# Patient Record
Sex: Female | Born: 1960 | Race: Black or African American | Hispanic: Yes | Marital: Married
Health system: Northeastern US, Academic
[De-identification: ages and names within clinical notes are randomized; demographics above are authoritative.]

---

## 2019-09-09 ENCOUNTER — Inpatient Hospital Stay: Admit: 2019-09-09 | Discharge: 2019-09-09 | Payer: MEDICAID | Primary: Internal Medicine

## 2019-09-09 DIAGNOSIS — Z20822 Contact with and (suspected) exposure to covid-19: Secondary | ICD-10-CM

## 2019-09-09 DIAGNOSIS — Z20828 Contact with and (suspected) exposure to other viral communicable diseases: Secondary | ICD-10-CM

## 2019-09-10 LAB — COVID-19 CLEARANCE OR FOR PLACEMENT ONLY: BKR SARS-COV-2 RNA (COVID-19) (YH): NOT DETECTED

## 2019-10-14 ENCOUNTER — Inpatient Hospital Stay: Admit: 2019-10-14 | Discharge: 2019-10-14 | Payer: MEDICAID | Primary: Internal Medicine

## 2019-10-14 DIAGNOSIS — Z20828 Contact with and (suspected) exposure to other viral communicable diseases: Secondary | ICD-10-CM

## 2019-10-15 DIAGNOSIS — Z20822 Contact with and (suspected) exposure to covid-19: Secondary | ICD-10-CM

## 2019-10-15 LAB — SARS COV-2 (COVID-19) RNA: BKR SARS-COV-2 RNA (COVID-19) (YH): NOT DETECTED

## 2019-10-28 ENCOUNTER — Inpatient Hospital Stay: Admit: 2019-10-28 | Discharge: 2019-10-28 | Payer: MEDICAID | Primary: Internal Medicine

## 2019-10-28 DIAGNOSIS — Z20822 Contact with and (suspected) exposure to covid-19: Secondary | ICD-10-CM

## 2019-10-28 DIAGNOSIS — Z20828 Contact with and (suspected) exposure to other viral communicable diseases: Secondary | ICD-10-CM

## 2019-10-29 LAB — COVID-19 CLEARANCE OR FOR PLACEMENT ONLY: BKR SARS-COV-2 RNA (COVID-19) (YH): NOT DETECTED

## 2019-11-06 ENCOUNTER — Encounter: Admit: 2019-11-06 | Payer: PRIVATE HEALTH INSURANCE | Attending: Neuromuscular Medicine | Primary: Internal Medicine

## 2019-11-06 ENCOUNTER — Inpatient Hospital Stay: Admit: 2019-11-06 | Discharge: 2019-11-06 | Payer: MEDICAID | Primary: Internal Medicine

## 2019-11-06 DIAGNOSIS — IMO0002 Cystocele: Secondary | ICD-10-CM

## 2019-11-06 DIAGNOSIS — K219 Gastro-esophageal reflux disease without esophagitis: Secondary | ICD-10-CM

## 2019-11-06 DIAGNOSIS — M1990 Unspecified osteoarthritis, unspecified site: Secondary | ICD-10-CM

## 2019-11-06 DIAGNOSIS — E119 Type 2 diabetes mellitus without complications: Secondary | ICD-10-CM

## 2019-11-06 DIAGNOSIS — G5603 Carpal tunnel syndrome, bilateral upper limbs: Secondary | ICD-10-CM

## 2019-11-06 DIAGNOSIS — J309 Allergic rhinitis, unspecified: Secondary | ICD-10-CM

## 2019-11-06 DIAGNOSIS — X008XXA Other exposure to uncontrolled fire in building or structure, initial encounter: Secondary | ICD-10-CM

## 2019-11-06 DIAGNOSIS — K635 Polyp of colon: Secondary | ICD-10-CM

## 2019-11-06 DIAGNOSIS — F4310 Post-traumatic stress disorder, unspecified: Secondary | ICD-10-CM

## 2019-11-06 DIAGNOSIS — G5600 Carpal tunnel syndrome, unspecified upper limb: Secondary | ICD-10-CM

## 2019-11-06 DIAGNOSIS — F419 Anxiety disorder, unspecified: Secondary | ICD-10-CM

## 2019-11-06 DIAGNOSIS — J45909 Unspecified asthma, uncomplicated: Secondary | ICD-10-CM

## 2019-11-06 DIAGNOSIS — D219 Benign neoplasm of connective and other soft tissue, unspecified: Secondary | ICD-10-CM

## 2019-11-06 DIAGNOSIS — M4180 Other forms of scoliosis, site unspecified: Secondary | ICD-10-CM

## 2019-11-06 NOTE — Other
Results suggestive of mild bilateral CTS.Consider tx options at f/u later this month.

## 2019-11-11 ENCOUNTER — Inpatient Hospital Stay: Admit: 2019-11-11 | Discharge: 2019-11-11 | Payer: MEDICAID | Primary: Internal Medicine

## 2019-11-11 DIAGNOSIS — Z20828 Contact with and (suspected) exposure to other viral communicable diseases: Secondary | ICD-10-CM

## 2019-11-11 DIAGNOSIS — Z20822 Contact with and (suspected) exposure to covid-19: Secondary | ICD-10-CM

## 2019-11-12 LAB — COVID-19 CLEARANCE OR FOR PLACEMENT ONLY: BKR SARS-COV-2 RNA (COVID-19) (YH): NOT DETECTED

## 2019-12-02 ENCOUNTER — Inpatient Hospital Stay: Admit: 2019-12-02 | Discharge: 2019-12-02 | Payer: MEDICAID | Primary: Internal Medicine

## 2019-12-02 DIAGNOSIS — Z20828 Contact with and (suspected) exposure to other viral communicable diseases: Secondary | ICD-10-CM

## 2019-12-03 DIAGNOSIS — Z20822 Contact with and (suspected) exposure to covid-19: Secondary | ICD-10-CM

## 2019-12-03 LAB — COVID-19 CLEARANCE OR FOR PLACEMENT ONLY: BKR SARS-COV-2 RNA (COVID-19) (YH): NOT DETECTED

## 2019-12-17 ENCOUNTER — Inpatient Hospital Stay: Admit: 2019-12-17 | Discharge: 2019-12-17 | Payer: MEDICAID | Primary: Internal Medicine

## 2019-12-17 DIAGNOSIS — Z20828 Contact with and (suspected) exposure to other viral communicable diseases: Secondary | ICD-10-CM

## 2019-12-18 DIAGNOSIS — Z20822 Contact with and (suspected) exposure to covid-19: Secondary | ICD-10-CM

## 2019-12-18 LAB — COVID-19 CLEARANCE OR FOR PLACEMENT ONLY: BKR SARS-COV-2 RNA (COVID-19) (YH): NOT DETECTED

## 2019-12-30 ENCOUNTER — Inpatient Hospital Stay: Admit: 2019-12-30 | Discharge: 2019-12-30 | Payer: MEDICAID | Primary: Internal Medicine

## 2019-12-30 DIAGNOSIS — Z20822 Contact with and (suspected) exposure to covid-19: Secondary | ICD-10-CM

## 2019-12-30 DIAGNOSIS — Z20828 Contact with and (suspected) exposure to other viral communicable diseases: Secondary | ICD-10-CM

## 2019-12-31 LAB — COVID-19 CLEARANCE OR FOR PLACEMENT ONLY: BKR SARS-COV-2 RNA (COVID-19) (YH): NOT DETECTED

## 2020-01-03 ENCOUNTER — Ambulatory Visit: Admit: 2020-01-03 | Payer: MEDICAID | Attending: Plastic Surgery | Primary: Internal Medicine

## 2020-01-03 ENCOUNTER — Encounter: Admit: 2020-01-03 | Payer: PRIVATE HEALTH INSURANCE | Attending: Plastic Surgery | Primary: Internal Medicine

## 2020-01-03 DIAGNOSIS — M4180 Other forms of scoliosis, site unspecified: Secondary | ICD-10-CM

## 2020-01-03 DIAGNOSIS — F419 Anxiety disorder, unspecified: Secondary | ICD-10-CM

## 2020-01-03 DIAGNOSIS — E119 Type 2 diabetes mellitus without complications: Secondary | ICD-10-CM

## 2020-01-03 DIAGNOSIS — IMO0002 Cystocele: Secondary | ICD-10-CM

## 2020-01-03 DIAGNOSIS — J45909 Unspecified asthma, uncomplicated: Secondary | ICD-10-CM

## 2020-01-03 DIAGNOSIS — K219 Gastro-esophageal reflux disease without esophagitis: Secondary | ICD-10-CM

## 2020-01-03 DIAGNOSIS — K635 Polyp of colon: Secondary | ICD-10-CM

## 2020-01-03 DIAGNOSIS — F4310 Post-traumatic stress disorder, unspecified: Secondary | ICD-10-CM

## 2020-01-03 DIAGNOSIS — G5603 Carpal tunnel syndrome, bilateral upper limbs: Secondary | ICD-10-CM

## 2020-01-03 DIAGNOSIS — M1990 Unspecified osteoarthritis, unspecified site: Secondary | ICD-10-CM

## 2020-01-03 DIAGNOSIS — M65312 Trigger thumb, left thumb: Secondary | ICD-10-CM

## 2020-01-03 DIAGNOSIS — X008XXA Other exposure to uncontrolled fire in building or structure, initial encounter: Secondary | ICD-10-CM

## 2020-01-03 DIAGNOSIS — J309 Allergic rhinitis, unspecified: Secondary | ICD-10-CM

## 2020-01-03 DIAGNOSIS — D219 Benign neoplasm of connective and other soft tissue, unspecified: Secondary | ICD-10-CM
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
## 2020-01-13 ENCOUNTER — Inpatient Hospital Stay: Admit: 2020-01-13 | Discharge: 2020-01-13 | Payer: MEDICAID | Primary: Internal Medicine

## 2020-01-13 DIAGNOSIS — Z20822 Contact with and (suspected) exposure to covid-19: Secondary | ICD-10-CM

## 2020-01-13 DIAGNOSIS — Z20828 Contact with and (suspected) exposure to other viral communicable diseases: Secondary | ICD-10-CM

## 2020-01-14 LAB — COVID-19 CLEARANCE OR FOR PLACEMENT ONLY: BKR SARS-COV-2 RNA (COVID-19) (YH): NOT DETECTED

## 2020-01-27 ENCOUNTER — Inpatient Hospital Stay: Admit: 2020-01-27 | Discharge: 2020-01-27 | Payer: MEDICAID | Primary: Internal Medicine

## 2020-01-27 DIAGNOSIS — Z20822 Contact with and (suspected) exposure to covid-19: Secondary | ICD-10-CM

## 2020-01-27 DIAGNOSIS — Z20828 Contact with and (suspected) exposure to other viral communicable diseases: Secondary | ICD-10-CM

## 2020-01-28 LAB — COVID-19 CLEARANCE OR FOR PLACEMENT ONLY: BKR SARS-COV-2 RNA (COVID-19) (YH): NOT DETECTED

## 2020-02-12 ENCOUNTER — Inpatient Hospital Stay: Admit: 2020-02-12 | Discharge: 2020-02-12 | Payer: MEDICAID | Primary: Internal Medicine

## 2020-02-12 DIAGNOSIS — Z20822 Contact with and (suspected) exposure to covid-19: Secondary | ICD-10-CM

## 2020-02-12 DIAGNOSIS — Z20828 Contact with and (suspected) exposure to other viral communicable diseases: Secondary | ICD-10-CM

## 2020-02-13 LAB — COVID-19 CLEARANCE OR FOR PLACEMENT ONLY: BKR SARS-COV-2 RNA (COVID-19) (YH): NOT DETECTED

## 2020-02-20 ENCOUNTER — Encounter: Admit: 2020-02-20 | Payer: PRIVATE HEALTH INSURANCE | Attending: Adult Health | Primary: Internal Medicine

## 2020-02-20 DIAGNOSIS — Z01818 Encounter for other preprocedural examination: Secondary | ICD-10-CM

## 2020-03-01 ENCOUNTER — Ambulatory Visit: Admit: 2020-03-01 | Payer: PRIVATE HEALTH INSURANCE | Primary: Internal Medicine

## 2020-03-04 ENCOUNTER — Encounter: Admit: 2020-03-04 | Payer: PRIVATE HEALTH INSURANCE | Attending: Adult Health | Primary: Internal Medicine

## 2020-03-04 DIAGNOSIS — Z01818 Encounter for other preprocedural examination: Secondary | ICD-10-CM

## 2020-03-08 ENCOUNTER — Telehealth: Admit: 2020-03-08 | Payer: PRIVATE HEALTH INSURANCE | Attending: Physician Assistant | Primary: Internal Medicine

## 2020-03-12 ENCOUNTER — Encounter: Admit: 2020-03-12 | Payer: PRIVATE HEALTH INSURANCE | Attending: Plastic Surgery | Primary: Internal Medicine

## 2020-03-12 ENCOUNTER — Ambulatory Visit: Admit: 2020-03-12 | Payer: PRIVATE HEALTH INSURANCE | Attending: Anesthesiology | Primary: Internal Medicine

## 2020-03-12 DIAGNOSIS — K219 Gastro-esophageal reflux disease without esophagitis: Secondary | ICD-10-CM

## 2020-03-12 DIAGNOSIS — R112 Nausea with vomiting, unspecified: Secondary | ICD-10-CM

## 2020-03-12 DIAGNOSIS — M1990 Unspecified osteoarthritis, unspecified site: Secondary | ICD-10-CM

## 2020-03-12 DIAGNOSIS — K635 Polyp of colon: Secondary | ICD-10-CM

## 2020-03-12 DIAGNOSIS — U071 COVID-19 virus infection: Secondary | ICD-10-CM

## 2020-03-12 DIAGNOSIS — F4310 Post-traumatic stress disorder, unspecified: Secondary | ICD-10-CM

## 2020-03-12 DIAGNOSIS — M65312 Trigger thumb, left thumb: Secondary | ICD-10-CM

## 2020-03-12 DIAGNOSIS — J45909 Unspecified asthma, uncomplicated: Secondary | ICD-10-CM

## 2020-03-12 DIAGNOSIS — D219 Benign neoplasm of connective and other soft tissue, unspecified: Secondary | ICD-10-CM

## 2020-03-12 DIAGNOSIS — X008XXA Other exposure to uncontrolled fire in building or structure, initial encounter: Secondary | ICD-10-CM

## 2020-03-12 DIAGNOSIS — F419 Anxiety disorder, unspecified: Secondary | ICD-10-CM

## 2020-03-12 DIAGNOSIS — IMO0002 Cystocele: Secondary | ICD-10-CM

## 2020-03-12 DIAGNOSIS — M4180 Other forms of scoliosis, site unspecified: Secondary | ICD-10-CM

## 2020-03-12 DIAGNOSIS — J309 Allergic rhinitis, unspecified: Secondary | ICD-10-CM

## 2020-03-12 DIAGNOSIS — E119 Type 2 diabetes mellitus without complications: Secondary | ICD-10-CM

## 2020-03-15 ENCOUNTER — Inpatient Hospital Stay: Admit: 2020-03-15 | Discharge: 2020-03-15 | Payer: MEDICAID | Primary: Internal Medicine

## 2020-03-15 DIAGNOSIS — Z01812 Encounter for preprocedural laboratory examination: Secondary | ICD-10-CM

## 2020-03-15 DIAGNOSIS — Z20822 Contact with and (suspected) exposure to covid-19: Secondary | ICD-10-CM

## 2020-03-15 DIAGNOSIS — Z01818 Encounter for other preprocedural examination: Secondary | ICD-10-CM

## 2020-03-16 LAB — COVID-19 CLEARANCE OR FOR PLACEMENT ONLY: BKR SARS-COV-2 RNA (COVID-19) (YH): NEGATIVE

## 2020-03-18 ENCOUNTER — Encounter: Admit: 2020-03-18 | Payer: PRIVATE HEALTH INSURANCE | Attending: Plastic Surgery | Primary: Internal Medicine

## 2020-03-18 ENCOUNTER — Inpatient Hospital Stay: Admit: 2020-03-18 | Discharge: 2020-03-18 | Payer: MEDICAID | Primary: Internal Medicine

## 2020-03-18 ENCOUNTER — Ambulatory Visit: Admit: 2020-03-18 | Payer: PRIVATE HEALTH INSURANCE | Attending: Anesthesiology | Primary: Internal Medicine

## 2020-03-18 DIAGNOSIS — Z9109 Other allergy status, other than to drugs and biological substances: Secondary | ICD-10-CM

## 2020-03-18 DIAGNOSIS — G629 Polyneuropathy, unspecified: Secondary | ICD-10-CM

## 2020-03-18 DIAGNOSIS — IMO0002 Cystocele: Secondary | ICD-10-CM

## 2020-03-18 DIAGNOSIS — F419 Anxiety disorder, unspecified: Secondary | ICD-10-CM

## 2020-03-18 DIAGNOSIS — K219 Gastro-esophageal reflux disease without esophagitis: Secondary | ICD-10-CM

## 2020-03-18 DIAGNOSIS — Z86010 Personal history of colonic polyps: Secondary | ICD-10-CM

## 2020-03-18 DIAGNOSIS — R112 Nausea with vomiting, unspecified: Secondary | ICD-10-CM

## 2020-03-18 DIAGNOSIS — Z91048 Other nonmedicinal substance allergy status: Secondary | ICD-10-CM

## 2020-03-18 DIAGNOSIS — J45909 Unspecified asthma, uncomplicated: Secondary | ICD-10-CM

## 2020-03-18 DIAGNOSIS — E119 Type 2 diabetes mellitus without complications: Secondary | ICD-10-CM

## 2020-03-18 DIAGNOSIS — U071 COVID-19 virus infection: Secondary | ICD-10-CM

## 2020-03-18 DIAGNOSIS — M419 Scoliosis, unspecified: Secondary | ICD-10-CM

## 2020-03-18 DIAGNOSIS — G709 Myoneural disorder, unspecified: Secondary | ICD-10-CM

## 2020-03-18 DIAGNOSIS — F329 Major depressive disorder, single episode, unspecified: Secondary | ICD-10-CM

## 2020-03-18 DIAGNOSIS — M4180 Other forms of scoliosis, site unspecified: Secondary | ICD-10-CM

## 2020-03-18 DIAGNOSIS — M65312 Trigger thumb, left thumb: Secondary | ICD-10-CM

## 2020-03-18 DIAGNOSIS — Z79899 Other long term (current) drug therapy: Secondary | ICD-10-CM

## 2020-03-18 DIAGNOSIS — G5603 Carpal tunnel syndrome, bilateral upper limbs: Secondary | ICD-10-CM

## 2020-03-18 DIAGNOSIS — J309 Allergic rhinitis, unspecified: Secondary | ICD-10-CM

## 2020-03-18 DIAGNOSIS — Z888 Allergy status to other drugs, medicaments and biological substances status: Secondary | ICD-10-CM

## 2020-03-18 DIAGNOSIS — M1990 Unspecified osteoarthritis, unspecified site: Secondary | ICD-10-CM

## 2020-03-18 DIAGNOSIS — Z7984 Long term (current) use of oral hypoglycemic drugs: Secondary | ICD-10-CM

## 2020-03-18 DIAGNOSIS — F4310 Post-traumatic stress disorder, unspecified: Secondary | ICD-10-CM

## 2020-03-18 DIAGNOSIS — X008XXA Other exposure to uncontrolled fire in building or structure, initial encounter: Secondary | ICD-10-CM

## 2020-03-18 DIAGNOSIS — D219 Benign neoplasm of connective and other soft tissue, unspecified: Secondary | ICD-10-CM

## 2020-03-18 DIAGNOSIS — K635 Polyp of colon: Secondary | ICD-10-CM

## 2020-03-18 MED ORDER — SODIUM CHLORIDE 0.9 % IRRIGATION SOLUTION
0.9 % irrigation | Status: CP
Start: 2020-03-18 — End: ?
  Administered 2020-03-18: 12:00:00 0.9 % irrigation

## 2020-03-18 MED ORDER — LIDOCAINE (PF) 10 MG/ML (1 %) INJECTION SOLUTION
10 mg/mL (1 %) | Status: DC
Start: 2020-03-18 — End: 2020-03-18

## 2020-03-18 MED ORDER — FENTANYL (PF) 50 MCG/ML INJECTION SOLUTION
50 mcg/mL | INTRAVENOUS | Status: DC | PRN
Start: 2020-03-18 — End: 2020-03-18
  Administered 2020-03-18 (×2): 50 mcg/mL via INTRAVENOUS

## 2020-03-18 MED ORDER — FENTANYL (PF) 50 MCG/ML INJECTION SOLUTION
50 mcg/mL | Status: CP
Start: 2020-03-18 — End: ?

## 2020-03-18 MED ORDER — SODIUM CHLORIDE 0.9 % (FLUSH) INJECTION SYRINGE
0.9 % | Freq: Three times a day (TID) | INTRAVENOUS | Status: DC
Start: 2020-03-18 — End: 2020-03-18

## 2020-03-18 MED ORDER — CEFAZOLIN 1 GRAM SOLUTION FOR INJECTION
1 gram | INTRAVENOUS | Status: DC | PRN
Start: 2020-03-18 — End: 2020-03-18
  Administered 2020-03-18: 12:00:00 1 gram via INTRAVENOUS

## 2020-03-18 MED ORDER — HYDROMORPHONE 2 MG/ML INJECTION SOLUTION
2 mg/mL | INTRAVENOUS | Status: DC | PRN
Start: 2020-03-18 — End: 2020-03-18

## 2020-03-18 MED ORDER — MIDAZOLAM (PF) 1 MG/ML INJECTION SOLUTION
1 mg/mL | INTRAVENOUS | Status: DC | PRN
Start: 2020-03-18 — End: 2020-03-18
  Administered 2020-03-18: 12:00:00 1 mg/mL via INTRAVENOUS

## 2020-03-18 MED ORDER — MIDAZOLAM (PF) 1 MG/ML INJECTION SOLUTION
1 mg/mL | Status: CP
Start: 2020-03-18 — End: ?

## 2020-03-18 MED ORDER — CEFAZOLIN 1 GRAM SOLUTION FOR INJECTION
1 gram | Status: CP
Start: 2020-03-18 — End: ?

## 2020-03-18 MED ORDER — PROPOFOL 10 MG/ML INTRAVENOUS EMULSION
10 mg/mL | Status: CP
Start: 2020-03-18 — End: ?

## 2020-03-18 MED ORDER — FENTANYL (PF) 50 MCG/ML INJECTION SOLUTION
50 mcg/mL | INTRAVENOUS | Status: DC | PRN
Start: 2020-03-18 — End: 2020-03-18

## 2020-03-18 MED ORDER — DIPHENHYDRAMINE 50 MG/ML INJECTION SOLUTION
50 mg/mL | Freq: Once | INTRAVENOUS | Status: DC | PRN
Start: 2020-03-18 — End: 2020-03-18

## 2020-03-18 MED ORDER — LIDOCAINE 1%/EPI 1:100,000 + MARCAINE 0.5% (1:1) (MIXTURE)
Status: DC | PRN
Start: 2020-03-18 — End: 2020-03-18
  Administered 2020-03-18: 12:00:00 4.000 mL

## 2020-03-18 MED ORDER — BUPIVACAINE (PF) 0.5 % (5 MG/ML) INJECTION SOLUTION
0.5 % (5 mg/mL) | Status: DC
Start: 2020-03-18 — End: 2020-03-18

## 2020-03-18 MED ORDER — OXYCODONE-ACETAMINOPHEN 5 MG-325 MG TABLET
5-325 mg | ORAL | Status: DC | PRN
Start: 2020-03-18 — End: 2020-03-18

## 2020-03-18 MED ORDER — ONDANSETRON HCL (PF) 4 MG/2 ML INJECTION SOLUTION
4 mg/2 mL | INTRAVENOUS | Status: DC | PRN
Start: 2020-03-18 — End: 2020-03-18
  Administered 2020-03-18: 12:00:00 4 mg/2 mL via INTRAVENOUS

## 2020-03-18 MED ORDER — PROPOFOL 10 MG/ML INTRAVENOUS EMULSION
10 mg/mL | INTRAVENOUS | Status: DC | PRN
Start: 2020-03-18 — End: 2020-03-18
  Administered 2020-03-18: 12:00:00 10 mg/mL via INTRAVENOUS
  Administered 2020-03-18: 12:00:00 10 mL/h via INTRAVENOUS

## 2020-03-18 MED ORDER — NALOXONE 0.4 MG/ML INJECTION SOLUTION
0.4 mg/mL | INTRAVENOUS | Status: DC | PRN
Start: 2020-03-18 — End: 2020-03-18

## 2020-03-18 MED ORDER — ONDANSETRON HCL (PF) 4 MG/2 ML INJECTION SOLUTION
4 mg/2 mL | INTRAVENOUS | Status: DC | PRN
Start: 2020-03-18 — End: 2020-03-18

## 2020-03-18 MED ORDER — SODIUM CHLORIDE 0.9 % (FLUSH) INJECTION SYRINGE
0.9 % | INTRAVENOUS | Status: DC | PRN
Start: 2020-03-18 — End: 2020-03-18

## 2020-03-18 MED ORDER — ACETAMINOPHEN 325 MG TABLET
325 mg | ORAL_TABLET | Freq: Four times a day (QID) | ORAL | 12 refills | Status: AC | PRN
Start: 2020-03-18 — End: ?

## 2020-03-18 MED ORDER — LIDOCAINE 1 %-EPINEPHRINE 1:100,000 INJECTION SOLUTION
1 %-:00,000 | Status: DC
Start: 2020-03-18 — End: 2020-03-18

## 2020-03-18 MED ORDER — LACTATED RINGERS INTRAVENOUS SOLUTION
INTRAVENOUS | Status: DC | PRN
Start: 2020-03-18 — End: 2020-03-18
  Administered 2020-03-18: 11:00:00 via INTRAVENOUS

## 2020-03-18 NOTE — Brief Op Note
[facility]        [id number] Patient DOB: 06/04/1961     Surgery Date: 8/16/2021Surgeon(s) and Role:   Nathen May, MD - PrimaryAssistant(s):Resident: Cherrie Gauze, MDStaff:  Circulator: Rymer, Nehemiah Settle, RNScrub Person: Ashley Jacobs, PedroPre-Op Diagnosis: Carpal tunnel syndrome of left wrist [G56.02]Trigger finger of left thumb [M65.312] Procedure(s) and Anesthesia Type:   * LEFT CARPAL TUNNEL RELEASE, LEFT THUMB TRIGGER FINGER RELEASE - MONITORED ANESTHESIA CARE   * TENDON SHEATH INCISION - MONITORED ANESTHESIA CAREOperative Findings (enter relevant operative findings; normal anatomySigns of infection present at the time of surgery at the operative site: None Blood and Blood Products: none                 Drains:  noneImplants: * No implants in log * Specimens: * No specimens in log * Clinical Staging: n/aEBL: None       Post Operative Diagnosis: Carpal tunnel syndrome of left wrist [G56.02]Trigger finger of left thumb [name], PA-c8/16/20218:21 AM

## 2020-03-18 NOTE — Discharge Instructions
DISCHARGE INSTRUCTIONS:Keep your dressing clean and dry for 5 daysSaturday you may remove your dressing, may get it wet in the shower, pat dry, apply a Band aid Elevate your hand above your Heart as much as possible to avoid swelling and throbbing painOpen and close your fist 10 x every hour while your awake to prevent stiffnessMedications:Resume your home medicationsFor Pain alternate Tylenol with Motrin every 6 hours for the first 2 -3 daysPlease call the office if you have increase pain, swelling,redness or  bleeding from the surgical site

## 2020-03-18 NOTE — Anesthesia Post-Procedure Evaluation
Anesthesia Post-op NotePatient: Lynn Davenport SantosProcedure(s):  Procedure(s) (LRB):LEFT CARPAL TUNNEL RELEASE, LEFT THUMB TRIGGER FINGER RELEASE (Left)TENDON SHEATH INCISION (Left) Patient location: PACULast Vitals:  I have noted the vital signs as listed in the nursing notes.Mental status recovered: patient participates in evaluation: YesVital signs reviewed: YesRespiratory function stable:YesAirway is patent: YesCardiovascular function and hydration status stable: YesPain control satisfactory: YesNausea and vomiting control satisfactory:Yes

## 2020-03-18 NOTE — Other
Operative Diagnosis:Pre-op:   Carpal tunnel syndrome of left wrist [G56.02]Trigger finger of left thumb [M65.312] Patient Coded Diagnosis   Pre-op diagnosis: Carpal tunnel syndrome of left wrist, Trigger finger of left thumb  Post-op diagnosis: Carpal tunnel syndrome of left wrist, Trigger finger of left thumb  Patient Diagnosis   Pre-op diagnosis: Carpal tunnel syndrome of left wrist [G56.02], Trigger finger of left thumb [M65.312]  Post-op diagnosis:     Post-op diagnosis:   * Carpal tunnel syndrome of left wrist [G56.02]   * Trigger finger of left thumb [M65.312]Operative Procedure(s) :Procedure(s) (LRB):LEFT CARPAL TUNNEL RELEASE, LEFT THUMB TRIGGER FINGER RELEASE (Left)TENDON SHEATH INCISION (Left)Post-op Procedure & Diagnosis ConfirmationPost-op Diagnosis: Post-op Diagnosis confirmed (no changes)Post-op Procedure: Post-op Procedure confirmed (no changes)

## 2020-03-18 NOTE — Brief Op Note
[facility]        [id number] Patient DOB: 13-Sep-1960     Surgery Date: 8/16/2021Surgeon(s) and Role:   Nathen May, MD - PrimaryAssistant(s):Resident: Cherrie Gauze, MDStaff:  Circulator: Rymer, Nehemiah Settle, RNScrub Person: Ashley Jacobs, PedroPre-Op Diagnosis: Carpal tunnel syndrome of left wrist [G56.02]Trigger finger of left thumb [M65.312] Procedure(s) and Anesthesia Type:   * LEFT CARPAL TUNNEL RELEASE, LEFT THUMB TRIGGER FINGER RELEASE - MONITORED ANESTHESIA CARE   * TENDON SHEATH INCISION - MONITORED ANESTHESIA CAREOperative Findings (enter relevant operative findings; do not refer to an operative report that is not yet transcribed): As aboveSigns of infection present at the time of surgery at the operative site: None Blood and Blood Products: none                 Drains:  noneImplants: * No implants in log * Specimens: * No specimens in log * Clinical Staging: N/AEBL: Minimal       Post Operative Diagnosis: * No post-op diagnosis entered Cherrie Gauze, MD8/16/20218:18 AM

## 2020-03-18 NOTE — Other
Post Anesthesia Transfer of Care NotePatient: Lynn Reed) Performed: Procedure(s) (LRB):LEFT CARPAL TUNNEL RELEASE, LEFT THUMB TRIGGER FINGER RELEASE (Left)TENDON SHEATH INCISION (Left) Patient location: PACU Last Vitals: Vitals Value Taken Time BP 116/67 03/18/20 0820 Temp 36.1 ?C 03/18/20 0820 Pulse 68 03/18/20 0821 Resp 16 03/18/20 0821 SpO2 100 % 03/18/20 0821 Vitals shown include unvalidated device data.Level of consciousness: awake, alert  and orientedTransport Vital Signs:  Stable since the last set of recorded intra-operative vital signsComplications: noneIntra-operative Intake & Output and Antibiotics as per Anesthesia record and discussed with the RN.

## 2020-03-18 NOTE — Anesthesia Pre-Procedure Evaluation
This is a 59 y.o. female scheduled for LEFT CARPAL TUNNEL RELEASE, LEFT THUMB TRIGGER FINGER RELEASE (Left )TENDON SHEATH INCISION (Left ).Review of Systems/ Medical HistoryPatient summary, nursing notes, EKG/Cardiac Studies , Labs, pre-procedure vitals, height, weight and NPO status reviewed.No previous anesthesia concernsAnesthesia Evaluation:   History of anesthetic complications: Hx of PONV.  Perioperative Screening ToolPONV Screening risk factors: demonstrated PONV.Estimated body mass index is 25.66 kg/m? as calculated from the following:  Height as of this encounter: 5' 6 (1.676 m).  Weight as of this encounter: 72.1 kg. 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 Her GERD is poorly controlled.Endocrine/Metabolic: -Diabetes mellitus:  Patient has diabetes mellitus type 2. Her diabetes is well controlled.Behavioral/Psychiatric & Syndromes:  Patient has anxiety and depression.-Comments: Acute posttraumatic stress disorderPhysical ExamCardiovascular:    normal exam  Pulmonary:  normal exam  Airway:  Mallampati: IITM distance: >3 FBNeck ROM: fullAnesthesia PlanASA 3 The primary anesthesia plan is  MAC. Perioperative Code Status confirmed: It is my understanding that the patient is currently designated as 'Full Code' and will remain so throughout the perioperative period.Anesthesia informed consent obtained. Consent obtained from: patientUse of blood products: consented  The post operative pain plan is IV analgesics and per surgeon management.Plan discussed with CRNA.Anesthesiologist's Pre Op NoteI personally evaluated and examined the patient prior to the intra-operative phase of care.

## 2020-03-19 ENCOUNTER — Telehealth: Admit: 2020-03-19 | Payer: PRIVATE HEALTH INSURANCE | Attending: Plastic Surgery | Primary: Internal Medicine

## 2020-03-19 ENCOUNTER — Encounter: Admit: 2020-03-19 | Payer: PRIVATE HEALTH INSURANCE | Attending: Plastic Surgery | Primary: Internal Medicine

## 2020-03-19 NOTE — Telephone Encounter
Spoke with pt. She is concern about her thumb. She is experiencing numbness and discoloration that improves with elevation. She is better today. Pt will send a photo via MyChart

## 2020-03-19 NOTE — Telephone Encounter
Received photo from the pt. Advised to remove coban and reapply loosely. If she continues to have discomfort I offered her an appt for tomorrow.No further questions at this time

## 2020-03-19 NOTE — Telephone Encounter
Patient had surgery Left Carpel Tunnel yesterday, complaining of left thumb was blue and numb and cold to the touch last night. Has gotten better today, but getting cold again. Please call.

## 2020-03-19 NOTE — Telephone Encounter
Patient calling to schedule post-op appointment with DC. Please call to assist.

## 2020-03-25 NOTE — Telephone Encounter
[facility] MESSAGETime of call:   2:58 PMCaller:   Jocelyn Caller's relationship to patient:  daughter     Reason for call:   Hello, patients daughter Ezequiel Essex  is calling in to schedule her Post Op follow up and she wants to come in as soon as possible because she has to return to work and will need her return to work note written and wants to get everything sorted out. Jocelyn's call back is [phone]   Does caller request to speak to someone urgently?  yes   Best telephone number for callback:   [phone] time to return call:  Any Permission to leave message:  yes   [facility]

## 2020-03-25 NOTE — Other
[facility]OSPITALOPERATIVE REPORTCONFIDENTIAL - DO NOT COPY WITHOUT APPROPRIATE AUTHORIZATIONName:  SHAQUITA, FORT  Service:  SRCPERMCORUnit No:  [id number] CSN:  [id number] Service Area:  PLADate of Birth:  1962-05-04Date of Adm:  08/16/2021Dictated:  03/24/2020 15:08:53          Dictated by:  Nathen May, M.D.Transcribed:  03/24/2020 16:18:29       [id number] DATE OF PROCEDURE/SURGERY:  08/16/2021OPERATION:Open left carpal tunnel release. Open left trigger thumb release with A1 pulley incision.OPERATOR:Nechama Escutia, M.D.ASSISTANT:ANESTHESIOLOGIST:ANESTHESIA:PREOP DIAGNOSIS:Bilateral carpal tunnel syndrome, left trigger thumb.POSTOP DIAGNOSIS:Bilateral carpal tunnel syndrome, left trigger thumb.ESTIMATED BLOOD LOSS:N/ASPECIMENS REMOVED:N/AINDICATIONS FOR SURGERY:  This is a very pleasant Hispanic and Spanish-speaking woman who presented to my office complaining of bilateral hand numbness.  She has an EMG which confirms carpal tunnel syndrome.  She also complains of left thumb clicking and locking.  I discussed with the patient preoperatively that this is a common diagnosis of carpal tunnel syndrome and trigger thumb.  Discussed with her the risks and benefits of operation versus conservative management.  The patient decided to proceed with surgery.  Risks of this operation include bleeding, infection, recurrence, persistent numbness, pain, weakness, damage to surrounding structures such as nerves, arteries, veins, need for further surgery.PROCEDURE:  The patient was identified in the preop receiving area where consent was confirmed.  The patient was marked.  He was then brought back to the operating room where a time-out was performed, confirming the correct patient, laterality, and side of surgery.  The left upper extremity was then prepped and draped in normal sterile fashion.  Prior to initiating the operation, we performed a final time-out. The left hand was anesthetized with a total of 10 cc of 0.5% Marcaine mixed with 1% lidocaine with epinephrine which is evenly distributed through the subcutaneous tissues of her palm, left thumb metacarpophalangeal joint crease, and into the carpal tunnel.  The left upper extremity was then exsanguinated with an Esmarch tourniquet, and pneumatic tourniquet was elevated to 250 mmHg.  We initiated the operation by incising a line which was curvilinear in line with the 4th ray just ulnar to the thenar crease.  This was carried down through subcutaneous tissue with blunt dissection until we reached the transverse carpal ligament.  A self-retaining retractor was placed, and then a 15 blade was used to enter the carpal tunnel at its distal extent.  Once the carpal tunnel was entered, we were able to connect the space by incising the transverse carpal ligament from distal to proximal.  Once we reached the proximal stent, a curved blunt scissor was used to dissect above and below the carpal tunnel and then was passed in a sliding maneuver in order to complete the carpal tunnel release and open up the antebrachial fascia. We then turned our attention to the left thumb.  An incision was made over the ulnar aspect of the MP flexion crease overlying the A1 pulley.  This was carried down through subcutaneous tissue with blunt dissection, and right angle retractors were used to retract the radial and ulnar neurovascular bundles.  We then were able to identify the FPL tendon and the A1 pulley.  The A1 pulley was then incised with a 15 blade, and this was carried distally with a Advertising copywriter.  A running retractor was then used to retract the FPL tendon out of the incision and demonstrated a nodule.  There was no longer clicking or locking on the A1 pulley.  The tourniquet was then let down, and all incisions were copiously irrigated.  Hemostasis  was ensured with direct pressure and elevation.  We then closed all incisions with 4-0 plain gut sutures in an interrupted and horizontal mattress technique.  The hand was then dressed with bacitracin, Xeroform, 4 x 4, and a dry dressing with Coban.  The patient was woken from anesthesia and tolerated the procedure well.  All counts were correct at the completion of this case.  I was present, scrubbed for the entirety of this case.

## 2020-04-03 ENCOUNTER — Encounter: Admit: 2020-04-03 | Payer: PRIVATE HEALTH INSURANCE | Attending: Plastic Surgery | Primary: Internal Medicine

## 2020-04-03 ENCOUNTER — Encounter: Admit: 2020-04-03 | Payer: MEDICAID | Attending: Plastic Surgery | Primary: Internal Medicine

## 2020-04-03 DIAGNOSIS — J45909 Unspecified asthma, uncomplicated: Secondary | ICD-10-CM

## 2020-04-03 DIAGNOSIS — F419 Anxiety disorder, unspecified: Secondary | ICD-10-CM

## 2020-04-03 DIAGNOSIS — K635 Polyp of colon: Secondary | ICD-10-CM

## 2020-04-03 DIAGNOSIS — R112 Nausea with vomiting, unspecified: Secondary | ICD-10-CM

## 2020-04-03 DIAGNOSIS — M1990 Unspecified osteoarthritis, unspecified site: Secondary | ICD-10-CM

## 2020-04-03 DIAGNOSIS — X008XXA Other exposure to uncontrolled fire in building or structure, initial encounter: Secondary | ICD-10-CM

## 2020-04-03 DIAGNOSIS — M65312 Trigger thumb, left thumb: Secondary | ICD-10-CM

## 2020-04-03 DIAGNOSIS — D219 Benign neoplasm of connective and other soft tissue, unspecified: Secondary | ICD-10-CM

## 2020-04-03 DIAGNOSIS — J309 Allergic rhinitis, unspecified: Secondary | ICD-10-CM

## 2020-04-03 DIAGNOSIS — M4180 Other forms of scoliosis, site unspecified: Secondary | ICD-10-CM

## 2020-04-03 DIAGNOSIS — U071 COVID-19 virus infection: Secondary | ICD-10-CM

## 2020-04-03 DIAGNOSIS — Z9889 Other specified postprocedural states: Secondary | ICD-10-CM

## 2020-04-03 DIAGNOSIS — IMO0002 Cystocele: Secondary | ICD-10-CM

## 2020-04-03 DIAGNOSIS — E119 Type 2 diabetes mellitus without complications: Secondary | ICD-10-CM

## 2020-04-03 DIAGNOSIS — K219 Gastro-esophageal reflux disease without esophagitis: Secondary | ICD-10-CM

## 2020-04-03 DIAGNOSIS — Z4802 Encounter for removal of sutures: Secondary | ICD-10-CM

## 2020-04-03 DIAGNOSIS — F4310 Post-traumatic stress disorder, unspecified: Secondary | ICD-10-CM

## 2020-04-03 NOTE — Progress Notes
Plastic Surgery ClinicSeptember 1, 2021					Patient name: Lynn Reed MRN:      [id number] Date of birth:   May 28, 1962Date of visit:    9/1/2021Provider:	Aysa Larivee M Dhairya Corales, PA-CPOST OPERATIVE VISITDate of Surgery: 8/16/21Surgeon: Nathen May Procedure:  Left carpal tunnel release and left trigger thumb releaseMs. Reed is now 2 weeks s/p above procedure.Since her surgery she has had no issuesPain is not waking her up at night, she feels intermittent numbness along the incision sitePatient is doing well.Reports hand weaknessReview of Systems:Denies fever, chills, CP, SOB, cough, sore throat, peripheral edema, nausea, vomiting, rash, and tingling. Physical Exam:Alert, oriented x 3 Well developed, well nourished, NADHENT:  Normal cephalic, atraumaticNeck:  Supple, normal ROMCardiac:  Normal rate, no peripheral edemaPulmonary:  Normal breathing effort, no accessory muscle useSkin: Warm and dryPsychiatric: Normal mood and affectExam: Lynn Reed removed her dressing today's after surgery.  Her sutures were removed today.  The incision appears infection free and healing well.Full range of motion of thumb.Imaging: none todayTherapy:  Referral to hand therapy place to work on strengthening exercises. Plan and Follow-up: Lynn Reed is doing well after procedure. She was referred to therapy to work on strengthening.Follow up as needed.[name] to return to work on 04/09/20 Patient will contact the office if any problems present in the interim.Epimenio Schetter M Mangino9/08/2019 9:00 AM

## 2020-04-04 ENCOUNTER — Inpatient Hospital Stay: Admit: 2020-04-04 | Discharge: 2020-04-04 | Payer: MEDICAID | Primary: Internal Medicine

## 2020-04-04 DIAGNOSIS — M79642 Pain in left hand: Secondary | ICD-10-CM
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
## 2020-04-05 ENCOUNTER — Encounter: Admit: 2020-04-05 | Payer: PRIVATE HEALTH INSURANCE | Primary: Internal Medicine

## 2020-04-09 ENCOUNTER — Inpatient Hospital Stay: Admit: 2020-04-09 | Discharge: 2020-04-09 | Payer: MEDICAID | Primary: Internal Medicine

## 2020-04-09 NOTE — Other
REHABILITATION SERVICES AT [facility] [employer] [phone] [address] AvenueHamden [place] [address]DGLOV Number: [phone] Number: [id number] Therapy Daily Note9/7/2021Patient Name:  Lynn Reed Record Number:  [id number] Date of Birth:  Mar 09, 1962Therapist:  Dorathy Kinsman, MS,OT/L,CHTReferring Provider:  Genia Del Mangino, PA-CICD-10 Diagnosis(es):Problem List         ICD-10-CM   OT/ left CTR and Trigger thumb release  Left hand pain M79.642   03/21/2018 [facility]     Major depressive disorder, recurrent episode, 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 albuterol sulfate Inhale 2 puffs into the lungs every 6 (six) hours as needed for wheezing. ? blood sugar diagnostic TrueMetrix. Check glucose once daily for E11.9 ? diclofenac Apply topically 4 (four) times daily. ? Advair HFA Inhale 1 puff into the lungs 2 (two) times daily. After use, rinse mouth with water. ? fluticasone propionate Use 1 spray in each nostril daily. ? hydrOXYzine Take 1 tablet (10 mg total) by mouth daily as needed for anxiety. ? lancets TrueMetrix. Check glucose once daily for E11.9 ? lancing device with lancets Check blood sugar 1 time daily ? levocetirizine Take 1 tablet (5 mg total) by mouth every evening before dinner. For allergies ? pantoprazole Take 1 tablet (40 mg total) by mouth 2 (two) times daily before breakfast and dinner. 30 minutes prior to eating ? SITagliptin Take 1 tablet (100 mg total) by mouth daily. SubjectiveReports hand feel slightly betterObjectiveScar adhesions thumb and volar palmTreatment Provided This VisitCPT Code Interventions Timed Minutes Untimed Minutes Total Minutes Manual Therapy (97140) Scar mobilizationSoft tissue release forearm flexors as well as hypothenar and thenar musculatureManual intrinsic release 15  15 Therapeutic Exercise (97110) Joint blocking thumb MP and IP x 12 eachDifferential flexor tendon glides X 12 eachMN flossing X 10 15  15  N/A     N/A       Total Treatment Time: 15 AssessmentMild restrictions to intrinsic musculature persistsPlanFrequency:  2x per weekPlan for Next VisitManual, soft tissue, exercise

## 2020-04-10 NOTE — Telephone Encounter
[facility] MESSAGETime of call:   11:24 AMCaller:   Jocelyn Caller's relationship to patient:  Daughter  Calling from [employer], hospital, agency, etc.):  n/a   Reason for call:   Pt's daughter is calling because she stated when her mom was there for her last appt she gave dr Hillard Danker paperwork regarding her job and days shes missed because of surgery for the doctor to fill out and fax over but the job stated they have yet to receive the paperwork and the pt is requesting that the paperwork be sent over immediately because shes in jeopardy of possibly losing her job because they dont have documentation of why shes been out of work. Pt is requesting a call back once the paperwork has been sent or if the paperwork can be signed and her daughter can pick it up from the office, please advise.  Does caller request to speak to someone urgently?  yes   Best telephone number for callback:   [phone]  Best time to return call:   any Permission to leave message:  yes   [facility]

## 2020-04-12 NOTE — Other
[facility]	Surgically Focused H&PPatient Name: Sequoyah Ramone of Birth: 08/15/1962Surgical Date: 8/16/21Procedure:LEFT CARPAL TUNNEL RELEASE, LEFT THUMB TRIGGER FINGER RELEASE - Diagnosis/Indication: Left carpal Tunnel syndrome, left thumb trigger fingerPE:General: AAO x 3Heart: RRLungs: CTAAbd: soft, non tenderConsent is signed and in the chartSigned:Deshundra Waller A Ricci-Collins, PA-C 8/16/20216:53 AM

## 2020-04-16 ENCOUNTER — Inpatient Hospital Stay: Admit: 2020-04-16 | Discharge: 2020-04-16 | Payer: MEDICAID | Primary: Internal Medicine

## 2020-04-16 NOTE — Other
REHABILITATION SERVICES AT [facility] [place] [phone]Ham[address] [place] [address]WJXBJ Number: [phone] Number: [phone] Therapy Daily Note9/14/2021Patient Name:  Lynn Reed Record Number:  [id number] Date of Birth:  July 05, 1962Therapist:  Dorathy Kinsman, MS,OT/L,CHTReferring Provider:  Genia Del Mangino, PA-CICD-10 Diagnosis(es):Problem List         ICD-10-CM   OT/ left CTR and Trigger thumb release  Left hand pain M79.642   03/21/2018 [facility]     Major depressive disorder, recurrent episode, 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  NoMedication Review:Current Outpatient Medications Medication Sig ? albuterol sulfate Inhale 2 puffs into the lungs every 6 (six) hours as needed for wheezing. ? blood sugar diagnostic TrueMetrix. Check glucose once daily for E11.9 ? diclofenac Apply topically 4 (four) times daily. ? Advair HFA Inhale 1 puff into the lungs 2 (two) times daily. After use, rinse mouth with water. ? fluticasone propionate Use 1 spray in each nostril daily. ? hydrOXYzine Take 1 tablet (10 mg total) by mouth daily as needed for anxiety. ? lancets TrueMetrix. Check glucose once daily for E11.9 ? lancing device with lancets Check blood sugar 1 time daily ? levocetirizine Take 1 tablet (5 mg total) by mouth every evening before dinner. For allergies ? pantoprazole Take 1 tablet (40 mg total) by mouth 2 (two) times daily before breakfast and dinner. 30 minutes prior to eating ? SITagliptin Take 1 tablet (100 mg total) by mouth daily. Subjectivereports completing exercises at homeObjectiveSignificant scar volar wrist and thumbTreatment Provided This VisitCPT Code Interventions Timed Minutes Untimed Minutes Total Minutes Manual Therapy (97140) Scar mobilization. Soft tissue release forearm flexors and thenar/hypothenar musculature. Manual intrinsic release 15  15 Therapeutic Exercise (97110) Joint blocking thumb. Tendon glides and modified nerve flossing. Reviewed home recommendations 15  15 N/A     N/A       Total Treatment Time: 30 AssessmentRequired re education on technique with home exercisesPlanFrequency:  2x per weekPlan for Next VisitManual rom, neuro re ed, home program

## 2020-04-19 ENCOUNTER — Ambulatory Visit: Admit: 2020-04-19 | Payer: MEDICAID | Attending: Plastic Surgery | Primary: Internal Medicine

## 2020-04-19 ENCOUNTER — Encounter: Admit: 2020-04-19 | Payer: PRIVATE HEALTH INSURANCE | Attending: Plastic Surgery | Primary: Internal Medicine

## 2020-04-19 DIAGNOSIS — E119 Type 2 diabetes mellitus without complications: Secondary | ICD-10-CM

## 2020-04-19 DIAGNOSIS — M4180 Other forms of scoliosis, site unspecified: Secondary | ICD-10-CM

## 2020-04-19 DIAGNOSIS — X008XXA Other exposure to uncontrolled fire in building or structure, initial encounter: Secondary | ICD-10-CM

## 2020-04-19 DIAGNOSIS — M1990 Unspecified osteoarthritis, unspecified site: Secondary | ICD-10-CM

## 2020-04-19 DIAGNOSIS — J309 Allergic rhinitis, unspecified: Secondary | ICD-10-CM

## 2020-04-19 DIAGNOSIS — Z9889 Other specified postprocedural states: Secondary | ICD-10-CM

## 2020-04-19 DIAGNOSIS — U071 COVID-19 virus infection: Secondary | ICD-10-CM

## 2020-04-19 DIAGNOSIS — F419 Anxiety disorder, unspecified: Secondary | ICD-10-CM

## 2020-04-19 DIAGNOSIS — K635 Polyp of colon: Secondary | ICD-10-CM

## 2020-04-19 DIAGNOSIS — F4310 Post-traumatic stress disorder, unspecified: Secondary | ICD-10-CM

## 2020-04-19 DIAGNOSIS — D219 Benign neoplasm of connective and other soft tissue, unspecified: Secondary | ICD-10-CM

## 2020-04-19 DIAGNOSIS — R112 Nausea with vomiting, unspecified: Secondary | ICD-10-CM

## 2020-04-19 DIAGNOSIS — IMO0002 Cystocele: Secondary | ICD-10-CM

## 2020-04-19 DIAGNOSIS — K219 Gastro-esophageal reflux disease without esophagitis: Secondary | ICD-10-CM

## 2020-04-19 DIAGNOSIS — J45909 Unspecified asthma, uncomplicated: Secondary | ICD-10-CM

## 2020-04-22 ENCOUNTER — Encounter: Admit: 2020-04-22 | Payer: PRIVATE HEALTH INSURANCE | Attending: Plastic Surgery | Primary: Internal Medicine

## 2020-04-22 DIAGNOSIS — X008XXA Other exposure to uncontrolled fire in building or structure, initial encounter: Secondary | ICD-10-CM

## 2020-04-22 DIAGNOSIS — R112 Nausea with vomiting, unspecified: Secondary | ICD-10-CM

## 2020-04-22 DIAGNOSIS — F419 Anxiety disorder, unspecified: Secondary | ICD-10-CM

## 2020-04-22 DIAGNOSIS — F4310 Post-traumatic stress disorder, unspecified: Secondary | ICD-10-CM

## 2020-04-22 DIAGNOSIS — J45909 Unspecified asthma, uncomplicated: Secondary | ICD-10-CM

## 2020-04-22 DIAGNOSIS — M4180 Other forms of scoliosis, site unspecified: Secondary | ICD-10-CM

## 2020-04-22 DIAGNOSIS — K219 Gastro-esophageal reflux disease without esophagitis: Secondary | ICD-10-CM

## 2020-04-22 DIAGNOSIS — U071 COVID-19 virus infection: Secondary | ICD-10-CM

## 2020-04-22 DIAGNOSIS — E119 Type 2 diabetes mellitus without complications: Secondary | ICD-10-CM

## 2020-04-22 DIAGNOSIS — IMO0002 Cystocele: Secondary | ICD-10-CM

## 2020-04-22 DIAGNOSIS — D219 Benign neoplasm of connective and other soft tissue, unspecified: Secondary | ICD-10-CM

## 2020-04-22 DIAGNOSIS — J309 Allergic rhinitis, unspecified: Secondary | ICD-10-CM

## 2020-04-22 DIAGNOSIS — M1990 Unspecified osteoarthritis, unspecified site: Secondary | ICD-10-CM

## 2020-04-22 DIAGNOSIS — K635 Polyp of colon: Secondary | ICD-10-CM

## 2020-04-22 NOTE — Progress Notes
[facility] ClinicSeptember 17, 2021					Patient name: Lynn Reed MRN:      [id number] Date of birth:   1962/04/30Date of visit:    9/17/2021Provider:	Genia Del Ashlynn Gunnels, PA-CPOST OPERATIVE VISITDate of Surgery: 08/16/21Surgeon: Nathen May Procedure: left carpal tunnel release, trigger release, left thumb Lynn Reed is now 6 weeks s/p above procedure.Since her surgery she has had no major issuesFully healed, ready to return to workReview of Systems:Denies fever, chills, CP, SOB, cough, sore throat, peripheral edema, nausea, vomiting, rash, and tingling. Physical Exam:Alert, oriented x 3 Well developed, well nourished, NADHENT:  Normal cephalic, atraumaticNeck:  Supple, normal ROMCardiac:  Normal rate, no peripheral edemaPulmonary:  Normal breathing effort, no accessory muscle useSkin: Warm and dryPsychiatric: Normal mood and affectExam: The incision appears infection free, fully healedAble to make a full fist Thumb opposition normal and appropriate strength and resistance Imaging:  None todayTherapy:  Ok to return to full duty Plan and Follow-up: Lynn Reed is recovering well from procedure. [name] to return to work Follow up as needed.Patient will contact the office if any problems present in the interim.Alin Chavira M Mangino9/17/2021 4:40 PM

## 2020-04-23 ENCOUNTER — Inpatient Hospital Stay: Admit: 2020-04-23 | Discharge: 2020-04-23 | Payer: MEDICAID | Primary: Internal Medicine

## 2020-04-23 NOTE — Other
REHABILITATION SERVICES AT [facility] [place] [phone]Hamden [place] [address]MWUXL Number: [phone] Number: [phone] Therapy Daily Note9/21/2021Patient Name:  Lynn Reed Record Number:  [id number] Date of Birth:  1962/01/18Therapist:  Dorathy Kinsman, MS,OT/L,CHTReferring Provider:  Genia Del Mangino, PA-CICD-10 Diagnosis(es):Problem List         ICD-10-CM   OT/ left CTR and Trigger thumb release  Left hand pain M79.642   03/21/2018 [facility]     Major depressive disorder, recurrent episode, 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  NoCognition / Learning Assessment   Primary Learner Relationship:  Patient        Barriers to learning:  No barriers        Preferred language:  EnglishI reviewed the Patient Care Agreement and Attendance Form with the Patient/Family.  The Patient/Family verbalized understanding.Medication Review:Current Outpatient Medications Medication Sig ? albuterol sulfate Inhale 2 puffs into the lungs every 6 (six) hours as needed for wheezing. ? blood sugar diagnostic TrueMetrix. Check glucose once daily for E11.9 ? diclofenac Apply topically 4 (four) times daily. ? Advair HFA Inhale 1 puff into the lungs 2 (two) times daily. After use, rinse mouth with water. ? fluticasone propionate Use 1 spray in each nostril daily. ? hydrOXYzine Take 1 tablet (10 mg total) by mouth daily as needed for anxiety. ? lancets TrueMetrix. Check glucose once daily for E11.9 ? lancing device with lancets Check blood sugar 1 time daily ? levocetirizine Take 1 tablet (5 mg total) by mouth every evening before dinner. For allergies ? pantoprazole Take 1 tablet (40 mg total) by mouth 2 (two) times daily before breakfast and dinner. 30 minutes prior to eating ? SITagliptin Take 1 tablet (100 mg total) by mouth daily. SubjectiveContinues to report paresthesiasObjectiveIntrinsic tightness. Volar scar / incision remains with moderate scar tissue.Treatment Provided This VisitCPT Code Interventions Timed Minutes Untimed Minutes Total Minutes Manual Therapy (97140) Scar mobilization.Soft tissue release extrinsic forearm musculature as well as manual release of intrinsics with manual stretch. Skin lengthening thumb web space. 15   Therapeutic Exercise (97110) Neural flossing, differential tendon glides. 15   N/A     N/A       Total Treatment Time:  AssessmentDiscussed with resolution of paresthesias will likely occur several months from now.PlanFrequency:  1x per weekPlan for Next VisitProgress to home program.

## 2020-04-26 ENCOUNTER — Telehealth: Admit: 2020-04-26 | Payer: PRIVATE HEALTH INSURANCE | Attending: Plastic Surgery | Primary: Internal Medicine

## 2020-04-26 NOTE — Telephone Encounter
Pt has returned to work with increase hand movement. At the ned of her shift her hand hurts. Pt currently not taking anything for pain. Pt advised to take otc before work to help with pain. Pt advise to give her hand time to adjust to returning to work.

## 2020-04-26 NOTE — Telephone Encounter
[facility] MESSAGETime of call:   9:50 AMCaller:   JocelynCaller's relationship to patient:  daughter  Calling from (pharmacy, hospital, agency, etc.):     Reason for call:   Hello, Patients daughter called in and stated patient has questions regarding pain and throbbing in her hand and she wants to know if this is normal post op or not. She is also experiencing stiffness and pulsing. She would like a call to [phone] If not feeling well, what are symptoms:  n/a   If having symptoms, how long have the symptoms been present:  n/a   Does caller request to speak to someone urgently?  no   Best telephone number for callback:   [phone]  Best time to return call:   any Permission to leave message:  yes   [facility]

## 2020-05-02 ENCOUNTER — Ambulatory Visit: Admit: 2020-05-02 | Payer: PRIVATE HEALTH INSURANCE | Primary: Internal Medicine

## 2020-05-02 ENCOUNTER — Inpatient Hospital Stay: Admit: 2020-05-02 | Discharge: 2020-05-02 | Payer: MEDICAID | Primary: Internal Medicine

## 2020-05-02 DIAGNOSIS — M79642 Pain in left hand: Secondary | ICD-10-CM

## 2020-05-02 DIAGNOSIS — M65312 Trigger thumb, left thumb: Secondary | ICD-10-CM

## 2020-05-02 DIAGNOSIS — Z20822 Contact with and (suspected) exposure to covid-19: Secondary | ICD-10-CM

## 2020-05-02 DIAGNOSIS — Z20828 Contact with and (suspected) exposure to other viral communicable diseases: Secondary | ICD-10-CM

## 2020-05-03 LAB — SARS COV-2 (COVID-19) RNA-~~LOC~~ LABS (BH GH LMW YH): BKR SARS-COV-2 RNA (COVID-19) (YH): NEGATIVE

## 2020-05-28 ENCOUNTER — Inpatient Hospital Stay: Admit: 2020-05-28 | Discharge: 2020-05-28 | Payer: MEDICAID | Primary: Internal Medicine

## 2020-05-28 DIAGNOSIS — Z1231 Encounter for screening mammogram for malignant neoplasm of breast: Secondary | ICD-10-CM

## 2020-05-30 ENCOUNTER — Inpatient Hospital Stay: Admit: 2020-05-30 | Discharge: 2020-05-30 | Payer: MEDICAID | Primary: Internal Medicine

## 2020-05-30 DIAGNOSIS — G5603 Carpal tunnel syndrome, bilateral upper limbs: Secondary | ICD-10-CM

## 2020-05-30 NOTE — Other
REHABILITATION SERVICES AT [facility] [place] [phone] [address] AvenueHam[address] [place] [address]UUVO[place] Number: [phone] Number: [phone] Therapy Daily NotePatient Name:  Lynn Reed Record Number:  [id number] Date of Birth:  [birth year]Therapist:  Dorathy Kinsman, MS,OT/L,CHTReferring Provider:  Genia Del Mangino, PAICD-10 Diagnosis(es):Problem List         ICD-10-CM   OT/ left CTR and Trigger thumb release  Left hand pain M79.642   03/21/2018 [facility]     Major depressive disorder, recurrent episode, 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  Date of Visit:   05/30/2020  Treatment Number:   5  Date of Surgery:   03/18/2020  Progress Report Due Date:   06/14/2020  End Date of Authorization:   06/14/2020  MD Order Renewal Date:   11/12/2021Precautions/Limitations   Precautions/Limitations:  No known precautions/limitationsInterpreter Ecologist Utilized?  No?Cognition / Learning Assessment   Primary Learner Relationship:  Patient        Barriers to learning:  No barriers        Preferred language:  EnglishI reviewed the Patient Care Agreement and Attendance Form with the Patient/Family.  The Patient/Family verbalized understanding.Medication Review:Current Outpatient Medications Medication Sig ? albuterol sulfate Inhale 2 puffs into the lungs every 6 (six) hours as needed for wheezing. ? blood sugar diagnostic TrueMetrix. Check glucose once daily for E11.9 ? diclofenac Apply topically 4 (four) times daily. ? Advair HFA Inhale 1 puff into the lungs 2 (two) times daily. After use, rinse mouth with water. ? fluticasone propionate Use 1 spray in each nostril daily. ? hydrOXYzine Take 1 tablet (10 mg total) by mouth daily as needed for anxiety. ? ibuprofen Take 1 tablet (600 mg total) by mouth every 6 (six) hours as needed. ? lancets TrueMetrix. Check glucose once daily for E11.9 ? lancing device with lancets Check blood sugar 1 time daily ? levocetirizine Take 1 tablet (5 mg total) by mouth every evening before dinner. For allergies ? meclizine Take 1-2 tablets (12.5-25mg ) by mouth tid prn ? pantoprazole Take 1 tablet (40 mg total) by mouth 2 (two) times daily before breakfast and dinner. 30 minutes prior to eating ? psyllium Take 1 packet by mouth daily. ? SITagliptin Take 1 tablet (100 mg total) by mouth daily. SubjectiveReports much better. Some intermittent pain with pinch which appears to be thumb basal jointObjectiveEdema:  Circumferential               IF P1 = 6.0, PIP = 6.5               MF P1 = 6.2, PIP = 6.3               RF P1 = 5.8, PIP = 6.2               SF P1 = 5.6, PIP = 5.4?MMT/ROM:        Full active opposition using Kapandji score      Full active composite, intrinsic +/- fist without difficulty or restrictions      Active wrist F/E = 62/68  Right = 65/75Grip/Pinch:          Grip right = 55, left = 52 ( no c/o pain )        Key pinch right = 11, left = 8 ( mild symptoms basal joint )Sensation grossly intact MN distribution                                              ?  Treatment Provided This VisitCPT Code Interventions Timed Minutes Untimed Minutes Total Minutes Therapeutic Exercise [phone]) Re assessment completed. Reviewed HEP 20  20 Orthotic/Prosthetic Mgmt/Train Subsequent Encounter [phone]) Fabricated hand based thumb orthosis and educated to wear when needed for thumb basal joint pain. 15  15 N/A     N/A       Total Treatment Time: 35 Hand Tools/Scales/Outcome Measures?QuickDASH - Upper Extremity Function      Open a tight or new jar:  2      Do heavy household chores:  2      Carrying a shopping bag or briefcase:  2      Wash your back:  1      Use a knife to cut food:  2      Activities with force/impact through arm, shoulder or hand:  2      To what extent has problem interfered with normal social activities:  1      Are you limited in work or other daily activities:  2      Severity of arm, shoulder or hand pain:  2      Tingling in arm, shoulder or hand:  1      How much trouble sleeping due to pain in arm, shoulder or hand:  1   Score:  [id number]   (The QDASH score ranges from 0-100, with higher scores reflecting increased disability)?Goals1) Full active fist in three positions to facilitate various grip activity MET2) Increase grip strength by 20 psi to facilitate increased ease with opening jar tops. MET3) Full active wrist mobility for pre grasp tasks. METAssessmentPatient has made excellent gains despite not attending therapy secondary to illness for several weeks.All goals have been met at this time.PlanDischarge with home recommendations

## 2020-06-15 ENCOUNTER — Ambulatory Visit: Admit: 2020-06-15 | Payer: MEDICAID | Primary: Internal Medicine

## 2020-07-25 ENCOUNTER — Ambulatory Visit: Admit: 2020-07-25 | Payer: PRIVATE HEALTH INSURANCE | Attending: Internal Medicine | Primary: Internal Medicine

## 2020-07-25 DIAGNOSIS — Z20822 Contact with and (suspected) exposure to covid-19: Secondary | ICD-10-CM

## 2020-08-24 ENCOUNTER — Inpatient Hospital Stay: Admit: 2020-08-24 | Discharge: 2020-08-24 | Payer: MEDICAID | Primary: Internal Medicine

## 2020-08-24 DIAGNOSIS — Z20828 Contact with and (suspected) exposure to other viral communicable diseases: Secondary | ICD-10-CM

## 2020-08-24 DIAGNOSIS — U071 COVID-19: Secondary | ICD-10-CM

## 2020-08-25 ENCOUNTER — Encounter: Admit: 2020-08-25 | Payer: PRIVATE HEALTH INSURANCE | Primary: Internal Medicine

## 2020-08-25 LAB — SARS COV-2 (COVID-19) RNA-~~LOC~~ LABS (BH GH LMW YH): BKR SARS-COV-2 RNA (COVID-19) (YH): POSITIVE — AB

## 2020-08-25 NOTE — Other
Pt positive test results, detailed MyChart message sent and contact information provided.

## 2020-08-30 IMAGING — MR JOELHO^DIREITO
4 of 6 series · 17 of 40 positions shown · non-contrast
Comparison: none

[Series 3: [id number]_fs_sag_320 · sagittal · right · 3.5mm · 0.62mm/px · 3 of 24 slices shown]
[im 4/24]
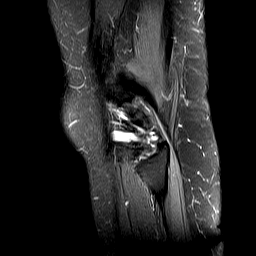
[im 12/24]
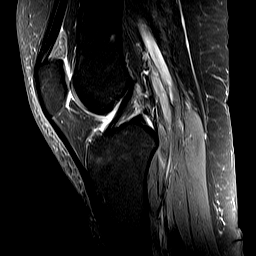
[im 20/24]
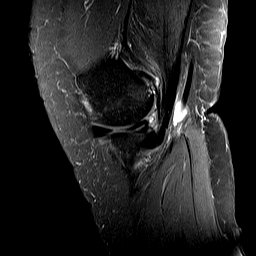

[Series 4: T1 · sagittal · right · 3.5mm · 0.28mm/px · 7 of 24 slices shown (1 of 2)]
[im 1/24]
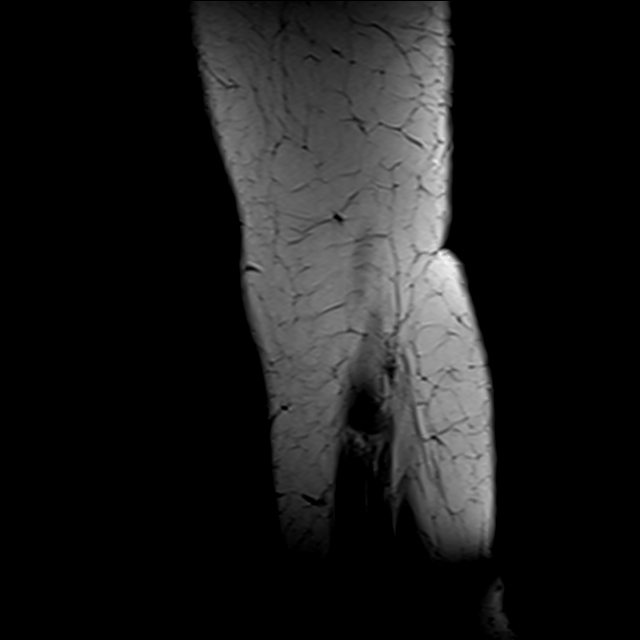
[im 4/24]
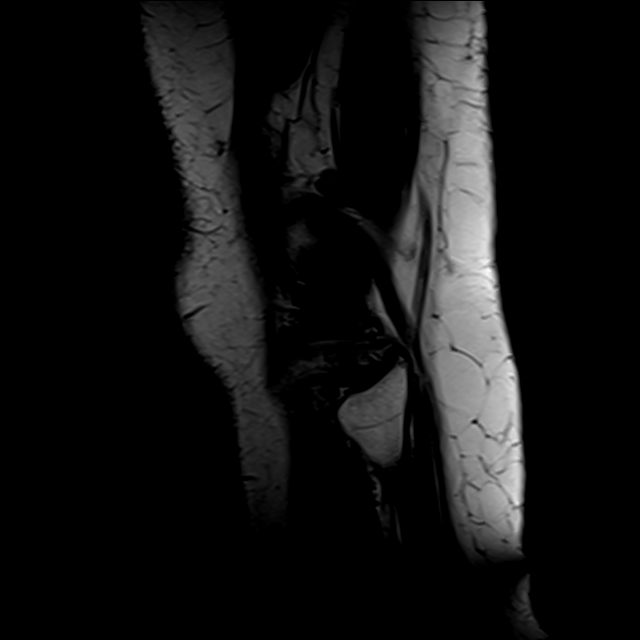
[im 8/24]
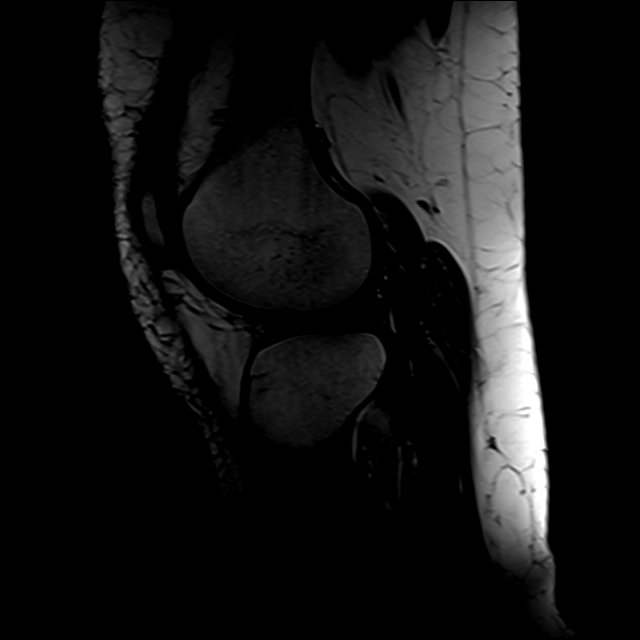
[im 12/24]
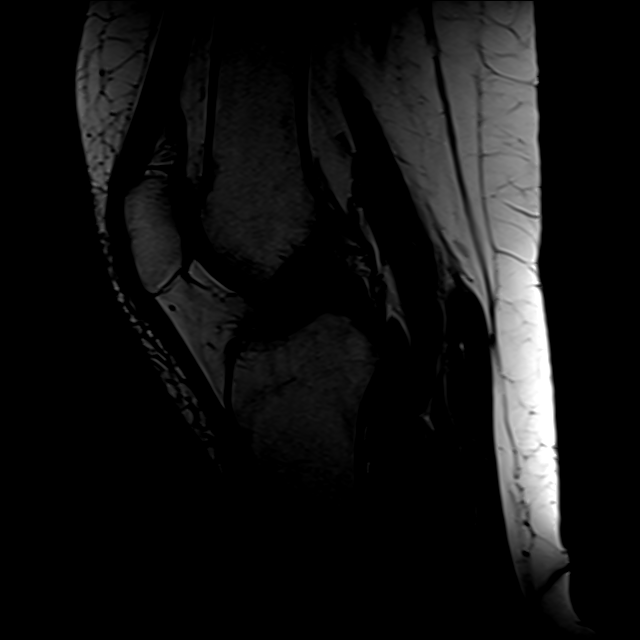
[im 16/24]
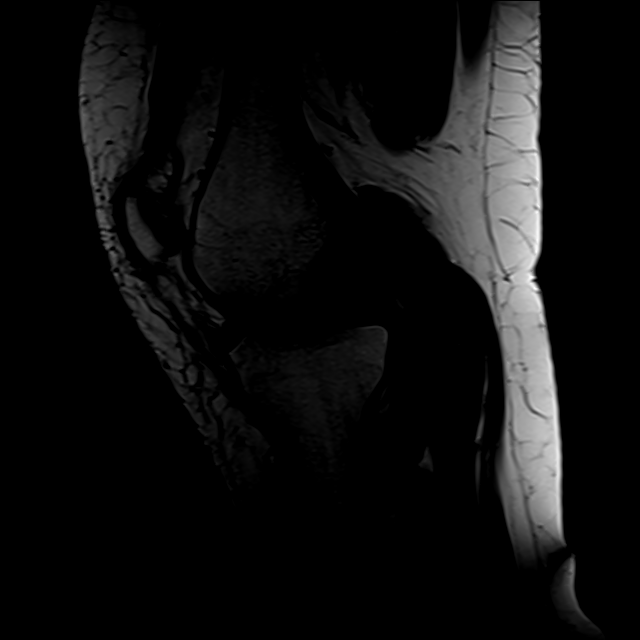
[im 20/24]
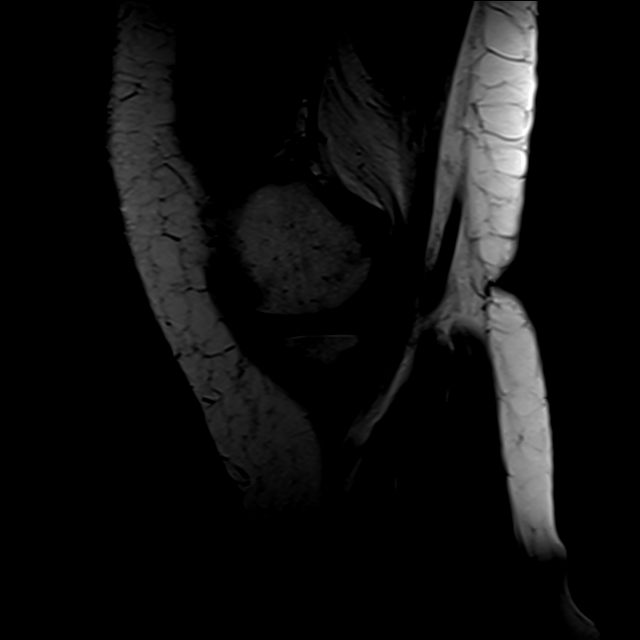
[im 24/24]
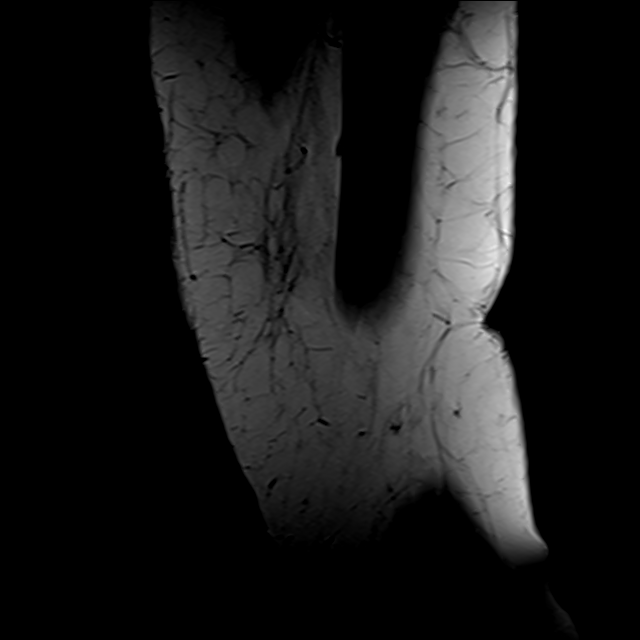

[Series 5: pd_tse_fs_cor_320 · coronal · right · 3.5mm · 0.62mm/px · 3 of 24 slices shown]
[im 4/24]
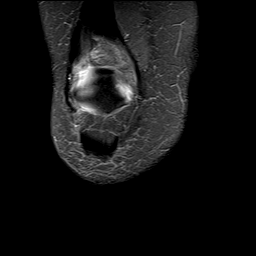
[im 12/24]
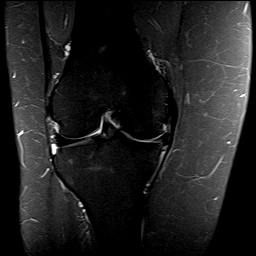
[im 20/24]
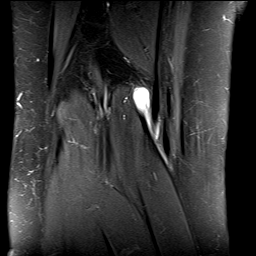

[Series 7: T1 · coronal · right · 3.5mm · 0.28mm/px · 4 of 24 slices shown (2 of 2)]
[im 1/24]
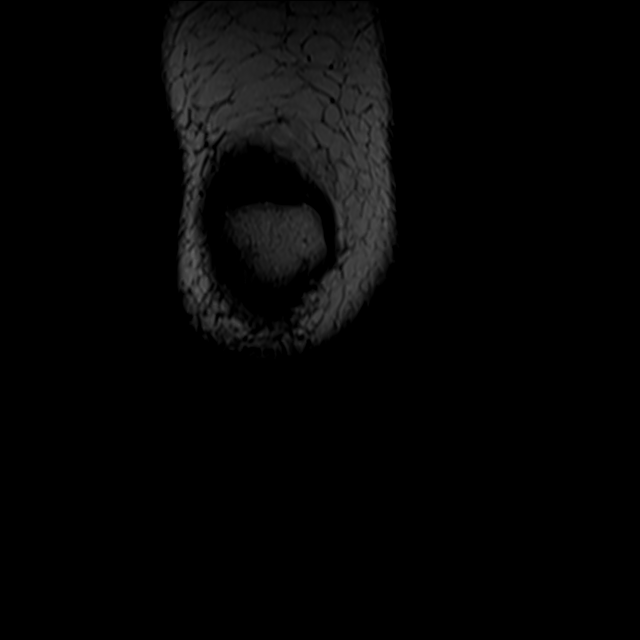
[im 4/24]
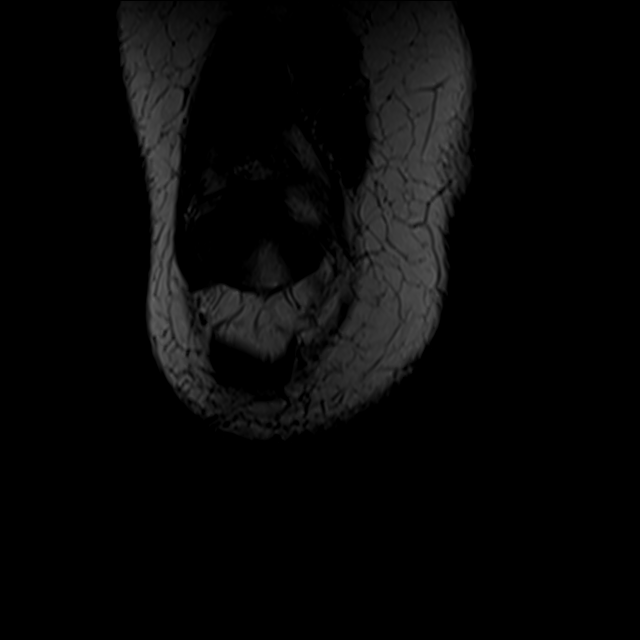
[im 12/24]
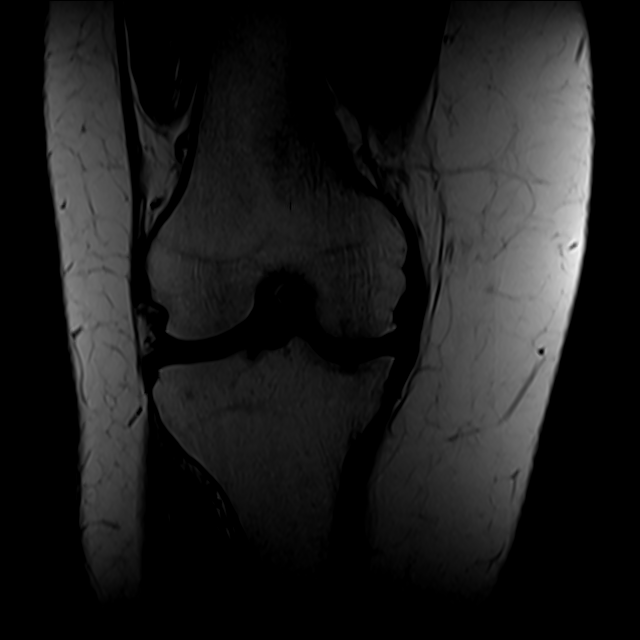
[im 20/24]
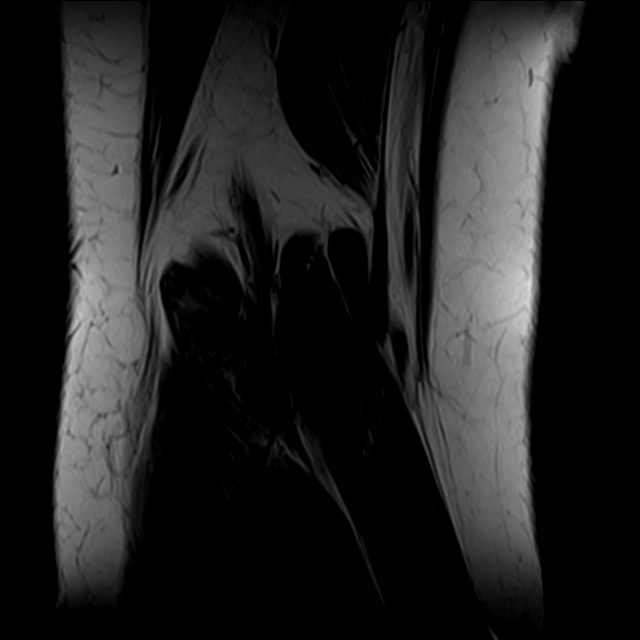

[17 of 40 positions shown; findings below may reference images not displayed]

Técnica:  Exame  obtido  com  sequências  Spin  Echo  ponderadas  em  T1  e  T2,  sem  injeção  do
contraste paramagnético.

Relatório:
Menisco medial levemente subluxado com alteração de sinal intrameniscal da transição posterior
RESSONÂNCIA MAGNÉTICA DO JOELHO DIREITO
ao corno posterior, atingindo a superfície articular inferior, sugerindo ruptura.
Menisco lateral com alteração degenerativa em seu interior, sem sinais de rupturas instáveis.
Ligamentos cruzados sem alterações.
Ligamentos colaterais e retináculos patelares preservados.
Cartilagem  patelo-femoral  com  leve  irregularidade  do  contorno  e  alteração  de  sinal  condral,
sugerindo condropatia degenerativa.
Edema na gordura suprapatelar, comumente relacionado a disfunção do mecanismo extensor.
Gordura de Hoffa preservada.
Sinais  de  condropatia  degenerativa  nos  compartimentos  femorotibiais  lateral  e  notadamente
medial, caracterizado por irregularidade do contorno e alteração de sinal condral, notadamente
em compartimento medial, neste com leve edema ósseo subcondral.
Pequena efusão articular com sinais de leve sinovite.
Tendões quadríceps e patelar com espessura e sinal normais.
Sinais de leve tendinose na origem do gastrocnêmio medial e na inserção do semimembranoso.
Sinais de leve bursite / peritendinite de pata anserina.
Cisto de Baker com leve migração cranial. 

Impressão Diagnóstica:
Leve  subluxação  do  menisco  medial  com  sinais  de  ruptura da  transição  posterior  ao  corno
posterior.
Alteração degenerativa no menisco lateral, sem rupturas instáveis.
Condropatia degenerativa patelo-femoral.
Edema na gordura suprapatelar, comumente relacionado a disfunção do mecanismo extensor.
Condropatia degenerativa nos compartimentos femorotibiais lateral e notadamente medial.
Sinais de leve tendinose na origem do gastrocnêmio medial e na inserção do semimembranoso.
Sinais de leve bursite / peritendinite de pata anserina.
Pequena efusão articular com leve sinovite.
Cisto de Baker.

## 2020-09-11 ENCOUNTER — Inpatient Hospital Stay: Admit: 2020-09-11 | Discharge: 2020-09-11 | Payer: MEDICAID | Primary: Internal Medicine

## 2020-09-11 DIAGNOSIS — Z20822 Contact with and (suspected) exposure to covid-19: Secondary | ICD-10-CM

## 2020-09-11 DIAGNOSIS — Z20828 Contact with and (suspected) exposure to other viral communicable diseases: Secondary | ICD-10-CM

## 2020-09-12 LAB — COVID-19 CLEARANCE OR FOR PLACEMENT ONLY: BKR SARS-COV-2 RNA (COVID-19) (YH): NEGATIVE

## 2021-02-01 ENCOUNTER — Inpatient Hospital Stay: Admit: 2021-02-01 | Discharge: 2021-02-01 | Payer: MEDICAID | Primary: Internal Medicine

## 2021-02-01 DIAGNOSIS — Z20822 Contact with and (suspected) exposure to covid-19: Secondary | ICD-10-CM

## 2021-02-01 DIAGNOSIS — Z20828 Contact with and (suspected) exposure to other viral communicable diseases: Secondary | ICD-10-CM

## 2021-02-02 LAB — SARS COV-2 (COVID-19) RNA-~~LOC~~ LABS (BH GH LMW YH): BKR SARS-COV-2 RNA (COVID-19) (YH): NOT DETECTED

## 2021-02-14 ENCOUNTER — Encounter: Admit: 2021-02-14 | Payer: PRIVATE HEALTH INSURANCE | Attending: Plastic Surgery | Primary: Internal Medicine

## 2021-02-14 ENCOUNTER — Ambulatory Visit: Admit: 2021-02-14 | Payer: MEDICAID | Attending: Plastic Surgery | Primary: Internal Medicine

## 2021-02-14 DIAGNOSIS — K219 Gastro-esophageal reflux disease without esophagitis: Secondary | ICD-10-CM

## 2021-02-14 DIAGNOSIS — D219 Benign neoplasm of connective and other soft tissue, unspecified: Secondary | ICD-10-CM

## 2021-02-14 DIAGNOSIS — F419 Anxiety disorder, unspecified: Secondary | ICD-10-CM

## 2021-02-14 DIAGNOSIS — X008XXA Other exposure to uncontrolled fire in building or structure, initial encounter: Secondary | ICD-10-CM

## 2021-02-14 DIAGNOSIS — J45909 Unspecified asthma, uncomplicated: Secondary | ICD-10-CM

## 2021-02-14 DIAGNOSIS — K635 Polyp of colon: Secondary | ICD-10-CM

## 2021-02-14 DIAGNOSIS — M65342 Trigger finger, left ring finger: Secondary | ICD-10-CM

## 2021-02-14 DIAGNOSIS — M1990 Unspecified osteoarthritis, unspecified site: Secondary | ICD-10-CM

## 2021-02-14 DIAGNOSIS — M4180 Other forms of scoliosis, site unspecified: Secondary | ICD-10-CM

## 2021-02-14 DIAGNOSIS — F4310 Post-traumatic stress disorder, unspecified: Secondary | ICD-10-CM

## 2021-02-14 DIAGNOSIS — R112 Nausea with vomiting, unspecified: Secondary | ICD-10-CM

## 2021-02-14 DIAGNOSIS — J309 Allergic rhinitis, unspecified: Secondary | ICD-10-CM

## 2021-02-14 DIAGNOSIS — U071 COVID-19 virus infection: Secondary | ICD-10-CM

## 2021-02-14 DIAGNOSIS — IMO0002 Cystocele: Secondary | ICD-10-CM

## 2021-02-14 DIAGNOSIS — E119 Type 2 diabetes mellitus without complications: Secondary | ICD-10-CM
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
## 2021-03-05 ENCOUNTER — Telehealth: Admit: 2021-03-05 | Payer: PRIVATE HEALTH INSURANCE | Attending: Plastic Surgery | Primary: Internal Medicine

## 2021-03-05 NOTE — Telephone Encounter
Pt called and stated she is to be seen by RM in 3 wks from her appt on 7/15 but theres no availability. Please advise on scheduling

## 2021-03-12 ENCOUNTER — Encounter: Admit: 2021-03-12 | Payer: PRIVATE HEALTH INSURANCE | Attending: Plastic Surgery | Primary: Internal Medicine

## 2021-03-12 ENCOUNTER — Ambulatory Visit: Admit: 2021-03-12 | Payer: MEDICAID | Attending: Plastic Surgery | Primary: Internal Medicine

## 2021-03-12 DIAGNOSIS — F419 Anxiety disorder, unspecified: Secondary | ICD-10-CM

## 2021-03-12 DIAGNOSIS — K219 Gastro-esophageal reflux disease without esophagitis: Secondary | ICD-10-CM

## 2021-03-12 DIAGNOSIS — K635 Polyp of colon: Secondary | ICD-10-CM

## 2021-03-12 DIAGNOSIS — R112 Nausea with vomiting, unspecified: Secondary | ICD-10-CM

## 2021-03-12 DIAGNOSIS — J45909 Unspecified asthma, uncomplicated: Secondary | ICD-10-CM

## 2021-03-12 DIAGNOSIS — E119 Type 2 diabetes mellitus without complications: Secondary | ICD-10-CM

## 2021-03-12 DIAGNOSIS — F4310 Post-traumatic stress disorder, unspecified: Secondary | ICD-10-CM

## 2021-03-12 DIAGNOSIS — M4180 Other forms of scoliosis, site unspecified: Secondary | ICD-10-CM

## 2021-03-12 DIAGNOSIS — X008XXA Other exposure to uncontrolled fire in building or structure, initial encounter: Secondary | ICD-10-CM

## 2021-03-12 DIAGNOSIS — U071 COVID-19 virus infection: Secondary | ICD-10-CM

## 2021-03-12 DIAGNOSIS — D219 Benign neoplasm of connective and other soft tissue, unspecified: Secondary | ICD-10-CM

## 2021-03-12 DIAGNOSIS — J309 Allergic rhinitis, unspecified: Secondary | ICD-10-CM

## 2021-03-12 DIAGNOSIS — IMO0002 Cystocele: Secondary | ICD-10-CM

## 2021-03-12 DIAGNOSIS — M1990 Unspecified osteoarthritis, unspecified site: Secondary | ICD-10-CM

## 2021-03-13 DIAGNOSIS — G5601 Carpal tunnel syndrome, right upper limb: Secondary | ICD-10-CM
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
## 2021-04-01 ENCOUNTER — Encounter: Admit: 2021-04-01 | Payer: PRIVATE HEALTH INSURANCE | Attending: Adult Health | Primary: Internal Medicine

## 2021-04-01 DIAGNOSIS — Z01818 Encounter for other preprocedural examination: Secondary | ICD-10-CM

## 2021-04-03 ENCOUNTER — Encounter: Admit: 2021-04-03 | Payer: PRIVATE HEALTH INSURANCE | Attending: Plastic Surgery | Primary: Internal Medicine

## 2021-04-03 DIAGNOSIS — E119 Type 2 diabetes mellitus without complications: Secondary | ICD-10-CM

## 2021-04-03 DIAGNOSIS — X008XXA Other exposure to uncontrolled fire in building or structure, initial encounter: Secondary | ICD-10-CM

## 2021-04-03 DIAGNOSIS — K635 Polyp of colon: Secondary | ICD-10-CM

## 2021-04-03 DIAGNOSIS — M1990 Unspecified osteoarthritis, unspecified site: Secondary | ICD-10-CM

## 2021-04-03 DIAGNOSIS — U071 COVID-19 virus infection: Secondary | ICD-10-CM

## 2021-04-03 DIAGNOSIS — M4180 Other forms of scoliosis, site unspecified: Secondary | ICD-10-CM

## 2021-04-03 DIAGNOSIS — F4310 Post-traumatic stress disorder, unspecified: Secondary | ICD-10-CM

## 2021-04-03 DIAGNOSIS — R112 Nausea with vomiting, unspecified: Secondary | ICD-10-CM

## 2021-04-03 DIAGNOSIS — D219 Benign neoplasm of connective and other soft tissue, unspecified: Secondary | ICD-10-CM

## 2021-04-03 DIAGNOSIS — F419 Anxiety disorder, unspecified: Secondary | ICD-10-CM

## 2021-04-03 DIAGNOSIS — J309 Allergic rhinitis, unspecified: Secondary | ICD-10-CM

## 2021-04-03 DIAGNOSIS — K219 Gastro-esophageal reflux disease without esophagitis: Secondary | ICD-10-CM

## 2021-04-03 DIAGNOSIS — IMO0002 Cystocele: Secondary | ICD-10-CM

## 2021-04-03 DIAGNOSIS — J45909 Unspecified asthma, uncomplicated: Secondary | ICD-10-CM

## 2021-04-11 ENCOUNTER — Ambulatory Visit: Admit: 2021-04-11 | Payer: MEDICAID | Attending: Anesthesiology | Primary: Internal Medicine

## 2021-04-11 ENCOUNTER — Inpatient Hospital Stay: Admit: 2021-04-11 | Discharge: 2021-04-11 | Payer: MEDICAID | Primary: Internal Medicine

## 2021-04-11 DIAGNOSIS — Z01818 Encounter for other preprocedural examination: Secondary | ICD-10-CM

## 2021-04-11 DIAGNOSIS — Z20822 Contact with and (suspected) exposure to covid-19: Secondary | ICD-10-CM

## 2021-04-11 DIAGNOSIS — Z01812 Encounter for preprocedural laboratory examination: Secondary | ICD-10-CM

## 2021-04-11 DIAGNOSIS — M65321 Trigger finger, right index finger: Secondary | ICD-10-CM

## 2021-04-12 LAB — COVID-19 CLEARANCE OR FOR PLACEMENT ONLY: BKR SARS-COV-2 RNA (COVID-19) (YH): NOT DETECTED

## 2021-04-14 ENCOUNTER — Encounter: Admit: 2021-04-14 | Payer: PRIVATE HEALTH INSURANCE | Attending: Plastic Surgery | Primary: Internal Medicine

## 2021-04-14 ENCOUNTER — Inpatient Hospital Stay: Admit: 2021-04-14 | Discharge: 2021-04-14 | Payer: MEDICAID | Attending: Plastic Surgery | Primary: Internal Medicine

## 2021-04-14 ENCOUNTER — Ambulatory Visit: Admit: 2021-04-14 | Payer: MEDICAID | Attending: Anesthesiology | Primary: Internal Medicine

## 2021-04-14 DIAGNOSIS — D219 Benign neoplasm of connective and other soft tissue, unspecified: Secondary | ICD-10-CM

## 2021-04-14 DIAGNOSIS — J45909 Unspecified asthma, uncomplicated: Secondary | ICD-10-CM

## 2021-04-14 DIAGNOSIS — M4180 Other forms of scoliosis, site unspecified: Secondary | ICD-10-CM

## 2021-04-14 DIAGNOSIS — U071 COVID-19 virus infection: Secondary | ICD-10-CM

## 2021-04-14 DIAGNOSIS — R112 Nausea with vomiting, unspecified: Secondary | ICD-10-CM

## 2021-04-14 DIAGNOSIS — E1140 Type 2 diabetes mellitus with diabetic neuropathy, unspecified: Secondary | ICD-10-CM

## 2021-04-14 DIAGNOSIS — K635 Polyp of colon: Secondary | ICD-10-CM

## 2021-04-14 DIAGNOSIS — Z79899 Other long term (current) drug therapy: Secondary | ICD-10-CM

## 2021-04-14 DIAGNOSIS — F419 Anxiety disorder, unspecified: Secondary | ICD-10-CM

## 2021-04-14 DIAGNOSIS — M419 Scoliosis, unspecified: Secondary | ICD-10-CM

## 2021-04-14 DIAGNOSIS — M542 Cervicalgia: Secondary | ICD-10-CM

## 2021-04-14 DIAGNOSIS — M1990 Unspecified osteoarthritis, unspecified site: Secondary | ICD-10-CM

## 2021-04-14 DIAGNOSIS — Z883 Allergy status to other anti-infective agents status: Secondary | ICD-10-CM

## 2021-04-14 DIAGNOSIS — K219 Gastro-esophageal reflux disease without esophagitis: Secondary | ICD-10-CM

## 2021-04-14 DIAGNOSIS — Z8616 Personal history of COVID-19: Secondary | ICD-10-CM

## 2021-04-14 DIAGNOSIS — E7800 Pure hypercholesterolemia, unspecified: Secondary | ICD-10-CM

## 2021-04-14 DIAGNOSIS — M65321 Trigger finger, right index finger: Secondary | ICD-10-CM

## 2021-04-14 DIAGNOSIS — Z91048 Other nonmedicinal substance allergy status: Secondary | ICD-10-CM

## 2021-04-14 DIAGNOSIS — J309 Allergic rhinitis, unspecified: Secondary | ICD-10-CM

## 2021-04-14 DIAGNOSIS — Z9109 Other allergy status, other than to drugs and biological substances: Secondary | ICD-10-CM

## 2021-04-14 DIAGNOSIS — F4310 Post-traumatic stress disorder, unspecified: Secondary | ICD-10-CM

## 2021-04-14 DIAGNOSIS — Z7984 Long term (current) use of oral hypoglycemic drugs: Secondary | ICD-10-CM

## 2021-04-14 DIAGNOSIS — Z888 Allergy status to other drugs, medicaments and biological substances status: Secondary | ICD-10-CM

## 2021-04-14 DIAGNOSIS — G5601 Carpal tunnel syndrome, right upper limb: Secondary | ICD-10-CM

## 2021-04-14 DIAGNOSIS — X008XXA Other exposure to uncontrolled fire in building or structure, initial encounter: Secondary | ICD-10-CM

## 2021-04-14 DIAGNOSIS — M65311 Trigger thumb, right thumb: Secondary | ICD-10-CM

## 2021-04-14 DIAGNOSIS — IMO0002 Cystocele: Secondary | ICD-10-CM

## 2021-04-14 DIAGNOSIS — E119 Type 2 diabetes mellitus without complications: Secondary | ICD-10-CM

## 2021-04-14 MED ORDER — ACETAMINOPHEN 325 MG TABLET
325 mg | Freq: Once | ORAL | Status: CP
Start: 2021-04-14 — End: ?
  Administered 2021-04-14: 16:00:00 325 mg via ORAL

## 2021-04-14 MED ORDER — ALBUTEROL SULFATE HFA 90 MCG/ACTUATION AEROSOL INHALER
90 mcg/actuation | RESPIRATORY_TRACT | Status: DC | PRN
Start: 2021-04-14 — End: 2021-04-14
  Administered 2021-04-14: 17:00:00 90 mcg/actuation via RESPIRATORY_TRACT

## 2021-04-14 MED ORDER — KETOROLAC 30 MG/ML (1 ML) INJECTION SOLUTION
30 mg/mL (1 mL) | INTRAVENOUS | Status: DC | PRN
Start: 2021-04-14 — End: 2021-04-14
  Administered 2021-04-14: 18:00:00 30 mg/mL (1 mL) via INTRAVENOUS

## 2021-04-14 MED ORDER — FENTANYL (PF) 50 MCG/ML INJECTION SOLUTION
50 mcg/mL | Status: CP
Start: 2021-04-14 — End: ?

## 2021-04-14 MED ORDER — CEFAZOLIN 1 GRAM SOLUTION FOR INJECTION
1 gram | Status: CP
Start: 2021-04-14 — End: ?

## 2021-04-14 MED ORDER — DIMENHYDRINATE 5 MG/ML IN 0.9% SODIUM CHLORIDE
INTRAVENOUS | Status: DC | PRN
Start: 2021-04-14 — End: 2021-04-14

## 2021-04-14 MED ORDER — LIDOCAINE 1 %-EPINEPHRINE 1:100,000 INJECTION SOLUTION
1 %-:00,000 | Status: DC | PRN
Start: 2021-04-14 — End: 2021-04-14
  Administered 2021-04-14: 17:00:00 1 %-:00,000

## 2021-04-14 MED ORDER — LACTATED RINGERS INTRAVENOUS SOLUTION
INTRAVENOUS | Status: DC
Start: 2021-04-14 — End: 2021-04-14

## 2021-04-14 MED ORDER — CHLORHEXIDINE GLUCONATE 2 % TOWELETTE
2 % | Freq: Once | TOPICAL | Status: DC
Start: 2021-04-14 — End: 2021-04-14

## 2021-04-14 MED ORDER — PHENYLEPHRINE 1 MG/10 ML (100 MCG/ML) IN 0.9 % SOD.CHLORIDE IV SYRINGE
1 mg/0 mL (00 mcg/mL) | Status: CP
Start: 2021-04-14 — End: ?

## 2021-04-14 MED ORDER — PHENYLEPHRINE 1 MG/10 ML (100 MCG/ML) IN 0.9 % SOD.CHLORIDE IV SYRINGE
1 mg/0 mL (00 mcg/mL) | INTRAVENOUS | Status: DC | PRN
Start: 2021-04-14 — End: 2021-04-14
  Administered 2021-04-14 (×5): 1 mg/0 mL (00 mcg/mL) via INTRAVENOUS

## 2021-04-14 MED ORDER — PROPOFOL 10 MG/ML INTRAVENOUS EMULSION
10 mg/mL | Status: CP
Start: 2021-04-14 — End: ?

## 2021-04-14 MED ORDER — LACTATED RINGERS INTRAVENOUS SOLUTION
INTRAVENOUS | Status: DC | PRN
Start: 2021-04-14 — End: 2021-04-14
  Administered 2021-04-14: 17:00:00 via INTRAVENOUS

## 2021-04-14 MED ORDER — ONDANSETRON HCL (PF) 4 MG/2 ML INJECTION SOLUTION
42 mg/2 mL | INTRAVENOUS | Status: DC | PRN
Start: 2021-04-14 — End: 2021-04-14

## 2021-04-14 MED ORDER — SODIUM CHLORIDE 0.9 % (FLUSH) INJECTION SYRINGE
0.9 % | Freq: Three times a day (TID) | INTRAVENOUS | Status: DC
Start: 2021-04-14 — End: 2021-04-14

## 2021-04-14 MED ORDER — FENTANYL (PF) 50 MCG/ML INJECTION SOLUTION
50 mcg/mL | INTRAVENOUS | Status: DC | PRN
Start: 2021-04-14 — End: 2021-04-14

## 2021-04-14 MED ORDER — ONDANSETRON HCL (PF) 4 MG/2 ML INJECTION SOLUTION
4 mg/2 mL | Status: CP
Start: 2021-04-14 — End: ?

## 2021-04-14 MED ORDER — ONDANSETRON HCL (PF) 4 MG/2 ML INJECTION SOLUTION
4 mg/2 mL | INTRAVENOUS | Status: DC | PRN
Start: 2021-04-14 — End: 2021-04-14
  Administered 2021-04-14: 18:00:00 4 mg/2 mL via INTRAVENOUS

## 2021-04-14 MED ORDER — GLYCOPYRROLATE 0.2 MG/ML INJECTION SOLUTION
0.2 mg/mL | Status: CP
Start: 2021-04-14 — End: ?

## 2021-04-14 MED ORDER — PROPOFOL 10 MG/ML INTRAVENOUS EMULSION
10 mg/mL | INTRAVENOUS | Status: DC | PRN
Start: 2021-04-14 — End: 2021-04-14
  Administered 2021-04-14: 18:00:00 10 mg/mL via INTRAVENOUS

## 2021-04-14 MED ORDER — PROPOFOL 10 MG/ML INTRAVENOUS EMULSION
10 mg/mL | INTRAVENOUS | Status: DC | PRN
Start: 2021-04-14 — End: 2021-04-14
  Administered 2021-04-14: 17:00:00 10 mL/h via INTRAVENOUS

## 2021-04-14 MED ORDER — FENTANYL (PF) 50 MCG/ML INJECTION SOLUTION
50 mcg/mL | INTRAVENOUS | Status: DC | PRN
Start: 2021-04-14 — End: 2021-04-14
  Administered 2021-04-14 (×2): 50 mcg/mL via INTRAVENOUS

## 2021-04-14 MED ORDER — SODIUM CHLORIDE 0.9 % IRRIGATION SOLUTION
0.9 % irrigation | Status: CP | PRN
Start: 2021-04-14 — End: ?
  Administered 2021-04-14: 17:00:00 0.9 % irrigation

## 2021-04-14 MED ORDER — EPHEDRINE 25 MG/5 ML IN 0.9% SODIUM CHLORIDE (WRAPPED ERX)
25 mg/5 mL (5 mg/mL) | Status: CP
Start: 2021-04-14 — End: ?

## 2021-04-14 MED ORDER — DEXAMETHASONE SODIUM PHOSPHATE 4 MG/ML INJECTION SOLUTION
4 mg/mL | Status: CP
Start: 2021-04-14 — End: ?

## 2021-04-14 MED ORDER — MIDAZOLAM (PF) 1 MG/ML INJECTION SOLUTION
1 mg/mL | Status: CP
Start: 2021-04-14 — End: ?

## 2021-04-14 MED ORDER — KETOROLAC 30 MG/ML (1 ML) INJECTION SOLUTION
30 mg/mL (1 mL) | Status: CP
Start: 2021-04-14 — End: ?

## 2021-04-14 MED ORDER — CHLORHEXIDINE GLUCONATE 0.12 % MOUTHWASH
0.12 % | Freq: Once | OROMUCOSAL | Status: CP
Start: 2021-04-14 — End: ?
  Administered 2021-04-14: 16:00:00 0.12 mL via OROMUCOSAL

## 2021-04-14 MED ORDER — ALBUTEROL SULFATE HFA 90 MCG/ACTUATION AEROSOL INHALER
90 mcg/actuation | Status: CP
Start: 2021-04-14 — End: ?

## 2021-04-14 MED ORDER — MIDAZOLAM (PF) 1 MG/ML INJECTION SOLUTION
1 mg/mL | INTRAVENOUS | Status: DC | PRN
Start: 2021-04-14 — End: 2021-04-14
  Administered 2021-04-14 (×2): 1 mg/mL via INTRAVENOUS

## 2021-04-14 MED ORDER — LIDOCAINE (PF) 20 MG/ML (2 %) INJECTION SOLUTION
20 mg/mL (2 %) | INTRAVENOUS | Status: DC | PRN
Start: 2021-04-14 — End: 2021-04-14
  Administered 2021-04-14: 17:00:00 20 mg/mL (2 %) via INTRAVENOUS

## 2021-04-14 MED ORDER — SCOPOLAMINE 1 MG OVER 3 DAYS TRANSDERMAL PATCH
1 mg | TRANSDERMAL | Status: DC
Start: 2021-04-14 — End: 2021-04-14

## 2021-04-14 MED ORDER — CEFAZOLIN 1 GRAM SOLUTION FOR INJECTION
1 gram | INTRAVENOUS | Status: DC | PRN
Start: 2021-04-14 — End: 2021-04-14
  Administered 2021-04-14: 17:00:00 1 gram via INTRAVENOUS

## 2021-04-14 MED ORDER — DIPHENHYDRAMINE 50 MG/ML INJECTION (WRAPPED E-RX)
50 mg/mL | Freq: Once | INTRAVENOUS | Status: DC | PRN
Start: 2021-04-14 — End: 2021-04-14

## 2021-04-14 MED ORDER — SODIUM CHLORIDE 0.9 % (FLUSH) INJECTION SYRINGE
0.9 % | INTRAVENOUS | Status: DC | PRN
Start: 2021-04-14 — End: 2021-04-14

## 2021-04-14 MED ORDER — ACETAMINOPHEN 325 MG TABLET
325 mg | Status: DC
Start: 2021-04-14 — End: 2021-04-14

## 2021-04-14 MED ORDER — OXYCODONE IMMEDIATE RELEASE 5 MG TABLET
5 mg | ORAL | Status: DC | PRN
Start: 2021-04-14 — End: 2021-04-14

## 2021-04-14 NOTE — Anesthesia Post-Procedure Evaluation
Anesthesia Post-op NotePatient: Lynn Davenport SantosProcedure(s):  Procedure(s) (LRB):right endoscopic open carpal tunnel release, thumb trigger release, index finger trigger release (Right)TENDON SHEATH INCISION (Right) Patient location: PACULast Vitals:  I have noted the vital signs as listed in the nursing notes.Mental status recovered: patient participates in evaluation: YesVital signs reviewed: YesRespiratory function stable:YesAirway is patent: YesCardiovascular function and hydration status stable: YesPain control satisfactory: YesNausea and vomiting control satisfactory:YesThere were no known complications for this encounter.

## 2021-04-14 NOTE — Discharge Instructions
Lynn Reed, MDPost-operative InstructionsShould any questions arise regarding your post-operative care, please call my office at [phone] - during business hours and our team will address any questions or concerns you may have. At night or on weekends, this number will connect you to the answering service who will connect you with the on-call physician.Prescription refills or changes cannot be addressed after normal business hours or on weekends.Follow-upPlease call for an appointment in 2 weeks to see Rafaela Mangino PAGeneral Post op InstructionsPlease allow your body time to heal. No sports, heavy lifting, or vigorous physical activity until you return for follow-up. Return to your normal diet as soon as you are able.It is normal to have some swelling and bruising of the hand and fingers post operatively. This can be uncomfortable. Prevent excessive swelling by performing the following:Elevate your hand above the level of your heart as much as possible during the first 12-24 hours post op.  Move your fingers as much as possible, curling them gently into making a fist and extending them all the way out. Do this as often as you are able. You may resume all of your home medications. If you are currently undergoing chemotherapy or treatment for a rheumatologic condition, please be sure to discuss the continuation of these medications with me prior to leaving the hospital. Incision Care and BathingPlease keep your dressing in place for 2 days, clean and dry. After 2 days, you may remove the dressing and wash the surgical sites with soap and water. No scrubbing the incisions.No bathing or swimming until sutures have been removed and the incision is completely healed. After the sutures have been removed, gently massage the scar with a moisturizing cream such as Aquaphor twice a day to promote healing and prevent excessive scar formation. Pain ControlTylenol (Acetominophen) and Motrin (Advil, Ibuprofen) should be the primary medication used for pain control. Please take as directed on the packaging.Emergencies/When to [facility] call the office if you have any questions or concerns regarding your post-operative care. We need to know if things are not going well. Please make sure you call the office or have the answering service page the 1st call plastic surgery resident immediately if you are having any of the following problems:Fever greater than 101.0Increasing pain or numbness not controlled on pain medicationsIncreasing bloody staining on the dressing Chest pain, difficulty breathing, nausea or vomitingCold fingers that are not normal color (pink) Any other concerning symptoms
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
## 2021-04-14 NOTE — Other
Post Anesthesia Transfer of Care NotePatient: Lynn Reed) Performed: Procedure(s) (LRB):right endoscopic open carpal tunnel release, thumb trigger release, index finger trigger release (Right)TENDON SHEATH INCISION (Right) Patient location: PACU Last Vitals: Vitals Value Taken Time BP 111/55 04/14/21 1415 Temp 36.5 ?C 04/14/21 1412 Pulse 74 04/14/21 1422 Resp 18 04/14/21 1422 SpO2 100 % 04/14/21 1422 .Level of consciousness: awakeTransport Vital Signs:  Stable since the last set of recorded intra-operative vital signsIntra-operative Complications: noneIntra-operative Intake & Output and Antibiotics as per Anesthesia record and discussed with the RN.

## 2021-04-14 NOTE — Other
Operative Diagnosis:Pre-op:   Carpal tunnel syndrome on right [G56.01]Trigger finger of right hand, unspecified finger [M65.30] Patient Coded Diagnosis   Pre-op diagnosis: Carpal tunnel syndrome on right, Trigger finger of right hand, unspecified finger  Post-op diagnosis: Carpal tunnel syndrome on right, Trigger finger of right hand, unspecified finger  Patient Diagnosis   Pre-op diagnosis: Carpal tunnel syndrome on right [G56.01], Trigger finger of right hand, unspecified finger [M65.30]  Post-op diagnosis:     Post-op diagnosis:   * Carpal tunnel syndrome on right [G56.01]   * Trigger finger of right hand, unspecified finger [M65.30]Operative Procedure(s) :Procedure(s) (LRB):right endoscopic open carpal tunnel release, thumb trigger release, index finger trigger release (Right)TENDON SHEATH INCISION (Right)Post-op Procedure & Diagnosis ConfirmationPost-op Diagnosis: Post-op Diagnosis updated (see notes)     - Carpal tunnel syndrome on right,?? ?Trigger finger of right handPost-op Procedure: Post-op Procedure updated (see notes)     -  right endoscopic open carpal tunnel release, thumb trigger release, index finger trigger release (Right Hand), TENDON SHEATH INCISION (Right Hand)

## 2021-04-14 NOTE — Anesthesia Pre-Procedure Evaluation
This is a 60 y.o. female scheduled for right endoscopic open carpal tunnel release, thumb trigger release, index finger trigger release (Right )TENDON SHEATH INCISION (Right ).Review of Systems/ Medical HistoryPatient summary, nursing notes, EKG/Cardiac Studies , Labs, pre-procedure vitals, height, weight and NPO status reviewed.Anesthesia Evaluation:   History of anesthetic complications: Hx of PONV.  Perioperative Screening ToolPONV Screening risk factors: demonstrated PONV.Estimated body mass index is 27.28 kg/m? (pended) as calculated from the following:  Height as of this encounter: 5' 6 (1.676 m).  Weight as of this encounter: (P) 76.7 kg. CC/HPI: Past Medical History:No date: Allergic rhinitis    Comment:  dust, trees,No date: AnxietyNo date: Arthritis    Comment:  kneesNo date: Asthma    Comment:  rescue inhaler 1-2 times a weekNo date: Colon polyp    Comment:  tubular adenoma11/17/2020: COVID-19 virus infectionNo date: CystoceleNo date: DextroscoliosisNo date: Diabetes mellitus, type 2 (HC Code)10/2010: Explosion caused by conflagration in private dwellingNo date: FibroidNo date: GERD (gastroesophageal reflux disease)    Comment:  not well controlledNo date: PONV (postoperative nausea and vomiting)No date: PTSD (post-traumatic stress disorder)CC.  Carpal tunnel syndrome on right,?Trigger finger of right handPast Surgical History:  Hysterectomy, Tubal Ligation, Oophorectomy, Colonoscopy, Carpal Tunnel, AppyCardiovascular:Patient has a history of: hypercholesterolemia.  No angina.  No dyspnea. -Exercise tolerance: >4 METS Respiratory: -Airway disorders: -Asthma: yes-Nicotine Dependence: noNeuromuscular:  Patient has a history of no seizures.-Intracranial disorders:  She did not have a cerebrovascular accident-Neuropathy: peripheral neuropathy-Muscle disorders: neuromuscular disease (chronic neck pain) -Comments: Foot Pain, has diabetic neuropathySpine/Spinal Cord Disorders:  History of scoliosis.Gastrointestinal/Genitourinary: -Gastrointestinal Disorders:  Patient has GERD.-Comments: Hx of CystoceleEndocrine/Metabolic: -Diabetes mellitus:  Patient has diabetes mellitus (HgbA1C  7.2) type 2.Behavioral/Psychiatric & Syndromes:  Patient has psychiatric history (PTSD).Physical ExamCardiovascular:    Rhythm: regularHeart Sounds: S1 present and S2 present.Pulmonary:  Patient's breath sounds clear to auscultationAirway:  Mallampati: ITM distance: >3 FBNeck ROM: fullDental:  normal exam  Anesthesia PlanASA 3 The primary anesthesia plan is  MAC. Standard monitors, peripheral IVs Perioperative Code Status confirmed: It is my understanding that the patient is currently designated as 'Full Code' and will remain so throughout the perioperative period.Anesthesia informed consent obtained. Consent obtained from: patientUse of blood products: consented  The post operative pain plan is per surgeon management and IV analgesics.Opioid administration likely.Plan discussed with CRNA.Anesthesiologist's Pre Op NoteI personally evaluated and examined the patient prior to the intra-operative phase of care.

## 2021-04-14 NOTE — Brief Op Note
[facility] HealthPatient Name: Lynn Reed        [id number] Patient DOB: November 17, 1960     Surgery Date: 9/12/2022Surgeon(s) and Role:   Nathen May, MD - PrimaryAssistant(s):Resident: Rayburn Go, MDStaff:  Circulator: Kathyrn Lass, RN; Tiffany Kocher, RNScrub Person: Everette Rank Diagnosis: Carpal tunnel syndrome on right [G56.01]Trigger finger of right hand, unspecified finger [M65.30] Procedure(s) and Anesthesia Type:   * right endoscopic open carpal tunnel release, thumb trigger release, index finger trigger release - MONITORED ANESTHESIA CARE   * TENDON SHEATH INCISION - MONITORED ANESTHESIA CAREOperative Findings (enter relevant operative findings; do not refer to an operative report that is not yet transcribed): right endoscopic carpal tunnel release, right thumb/index trigger finger releasesSigns of infection present at the time of surgery at the operative site: None Blood and Blood Products: none                 Drains:  noneImplants: Implant Name Type Inv. Item Serial No. Manufacturer Lot No. LRB No. Used Action C LINE ENDOSCOPIC SOFT TISS REL INST - [id number] Implant C LINE ENDOSCOPIC SOFT TISS REL INST  [employer] [id number]  1 Implanted  Specimens: * No specimens in log * Clinical Staging: n/aEBL: Minimal       Post Operative Diagnosis: * No post-op diagnosis entered * Rayburn Go, MD9/12/20222:19 PM

## 2021-04-15 ENCOUNTER — Encounter: Admit: 2021-04-15 | Payer: PRIVATE HEALTH INSURANCE | Attending: Plastic Surgery | Primary: Internal Medicine

## 2021-04-15 DIAGNOSIS — D219 Benign neoplasm of connective and other soft tissue, unspecified: Secondary | ICD-10-CM

## 2021-04-15 DIAGNOSIS — R112 Nausea with vomiting, unspecified: Secondary | ICD-10-CM

## 2021-04-15 DIAGNOSIS — U071 COVID-19 virus infection: Secondary | ICD-10-CM

## 2021-04-15 DIAGNOSIS — X008XXA Other exposure to uncontrolled fire in building or structure, initial encounter: Secondary | ICD-10-CM

## 2021-04-15 DIAGNOSIS — J45909 Unspecified asthma, uncomplicated: Secondary | ICD-10-CM

## 2021-04-15 DIAGNOSIS — M1990 Unspecified osteoarthritis, unspecified site: Secondary | ICD-10-CM

## 2021-04-15 DIAGNOSIS — E119 Type 2 diabetes mellitus without complications: Secondary | ICD-10-CM

## 2021-04-15 DIAGNOSIS — J309 Allergic rhinitis, unspecified: Secondary | ICD-10-CM

## 2021-04-15 DIAGNOSIS — K219 Gastro-esophageal reflux disease without esophagitis: Secondary | ICD-10-CM

## 2021-04-15 DIAGNOSIS — K635 Polyp of colon: Secondary | ICD-10-CM

## 2021-04-15 DIAGNOSIS — IMO0002 Cystocele: Secondary | ICD-10-CM

## 2021-04-15 DIAGNOSIS — F4310 Post-traumatic stress disorder, unspecified: Secondary | ICD-10-CM

## 2021-04-15 DIAGNOSIS — M4180 Other forms of scoliosis, site unspecified: Secondary | ICD-10-CM

## 2021-04-15 DIAGNOSIS — F419 Anxiety disorder, unspecified: Secondary | ICD-10-CM

## 2021-04-22 ENCOUNTER — Encounter: Admit: 2021-04-22 | Payer: MEDICAID | Attending: Plastic Surgery | Primary: Internal Medicine

## 2021-04-22 DIAGNOSIS — Z9889 Other specified postprocedural states: Secondary | ICD-10-CM

## 2021-04-22 DIAGNOSIS — M25649 Stiffness of unspecified hand, not elsewhere classified: Secondary | ICD-10-CM

## 2021-04-22 MED ORDER — IBUPROFEN 600 MG TABLET
600 mg | ORAL_TABLET | Freq: Four times a day (QID) | ORAL | 1 refills | Status: AC | PRN
Start: 2021-04-22 — End: 2021-08-29

## 2021-04-24 ENCOUNTER — Encounter: Admit: 2021-04-24 | Payer: PRIVATE HEALTH INSURANCE | Attending: Plastic Surgery | Primary: Internal Medicine

## 2021-04-24 DIAGNOSIS — K635 Polyp of colon: Secondary | ICD-10-CM

## 2021-04-24 DIAGNOSIS — M4180 Other forms of scoliosis, site unspecified: Secondary | ICD-10-CM

## 2021-04-24 DIAGNOSIS — J309 Allergic rhinitis, unspecified: Secondary | ICD-10-CM

## 2021-04-24 DIAGNOSIS — R112 Nausea with vomiting, unspecified: Secondary | ICD-10-CM

## 2021-04-24 DIAGNOSIS — E119 Type 2 diabetes mellitus without complications: Secondary | ICD-10-CM

## 2021-04-24 DIAGNOSIS — IMO0002 Cystocele: Secondary | ICD-10-CM

## 2021-04-24 DIAGNOSIS — X008XXA Other exposure to uncontrolled fire in building or structure, initial encounter: Secondary | ICD-10-CM

## 2021-04-24 DIAGNOSIS — K219 Gastro-esophageal reflux disease without esophagitis: Secondary | ICD-10-CM

## 2021-04-24 DIAGNOSIS — U071 COVID-19 virus infection: Secondary | ICD-10-CM

## 2021-04-24 DIAGNOSIS — D219 Benign neoplasm of connective and other soft tissue, unspecified: Secondary | ICD-10-CM

## 2021-04-24 DIAGNOSIS — J45909 Unspecified asthma, uncomplicated: Secondary | ICD-10-CM

## 2021-04-24 DIAGNOSIS — M1990 Unspecified osteoarthritis, unspecified site: Secondary | ICD-10-CM

## 2021-04-24 DIAGNOSIS — F419 Anxiety disorder, unspecified: Secondary | ICD-10-CM

## 2021-04-24 DIAGNOSIS — F4310 Post-traumatic stress disorder, unspecified: Secondary | ICD-10-CM

## 2021-04-24 NOTE — Progress Notes
Plastic Surgery ClinicSeptember 20, 2022			Patient name: Lynn Reed MRN:      [id number] Date of birth:   1962/02/19Date of visit:    9/20/2022Provider:	Kimba Lottes M Yoko Mcgahee, PAPOST OPERATIVE VISITDate of Surgery: 9/12/22Surgeon: Nathen May Procedure: Right endoscopic carpal tunnel release, thumb, index trigger release Lynn Reed is now 1.5 weeks s/p above procedure.Since her surgery she has had no issues, just some swellingReview of Systems:Denies fever, chills, CP, SOB, cough, sore throat, peripheral edema, nausea, vomiting, rash, and tingling. Physical Exam:Alert, oriented x 3 Well developed, well nourished, NADHENT:  Normal cephalic, atraumaticNeck:  Supple, normal ROMCardiac:  Normal rate, no peripheral edemaPulmonary:  Normal breathing effort, no accessory muscle useSkin: Warm and dryPsychiatric: Normal mood and affectExam: Lynn Reed was removed today.  Her sutures were intact and not ready to be removed.  The incision appears infection free and healing well.Imaging:  None todayTherapy:  ROM exercises reviewed and discussed. Referral to hand therapy placedPlan and Follow-up: Lynn Reed will start therapy and follow up in 1 week for suture removal.Patient will contact the office if any problems present in the interim.Lynn Chandley M Mangino9/20/2022 5:18 PM

## 2021-04-24 NOTE — Other
[facility] HealthPatient name:  Korrina Zern MR:	  [id number] Date of birth:	  1962/09/19OPERATIVE REPORTOPERATIVE REPORTDATE OF PROCEDURE/SURGERY: 9/12/22OPERATION: 1.	right endoscopic carpal tunnel release2.	Trigger release right index3.	Trigger release right thumbSURGEON: Nathen May, MDASSISTANT: Rayburn Go, MDANESTHESIA: Lesia Sago DIAGNOSIS: Carpal tunnel syndromeTrigger fingerPOSTOP DIAGNOSIS: SameCOMPLICATIONS:  NoneTOURNIQUET TIME:  20 minIMPLANTS:  noneSPECIMENS:  noneEBL: minimal DISPOSITION:  Stable to Recovery RoomINDICATIONS FOR SURGERY:  Lynn Reed is a 60 y.o. female who has history of right carpal tunnel syndrome. Given the severity of symptoms and EMG findings, we have elected to pursue surgical release. We discussed that we do not pursue release of both hands at the same time, as this would significantly restrict Her ability to perform Her ADLs. Risks and benefits of carpal tunnel release were discussed in detail including pain, bleeding, infection, injury to nearby structures, scar formation, issues with wound healing, nerve injury (including median, ulnar and palmar cutaneous nerves), temporary or permanent changes in sensation, stiffness, need for further or repeat procedures, and risks of anesthesia. Patient understood these risks and wished to go forward. She would like to release Her more severe, right side first. We also discussed the details of surgery:  Day surgery under local/MAC anesthesia, with a dressing to stay in place 3 days after which it's OK to wash hands but not to soak or soil them.  Suture removal at about 10-14 days.  Work limitation for up to 6 weeks for heavy labor, but generally return to most activities within 2-4 weeks.  Small risks of bleeding, infection, and injury to nerves or vessels.  We reviewed that while the surgery is reliable for pain relief, there is a possibility of failure to fully resolve symptoms especially parasthesias when symptoms are more severe and disease has been long-standing.  It can take months for nerve recovery. Consent was signed. Will plan to schedule at a mutually convenient time..  Due to the patient's desire to return to work they have elected to undergo endoscopic carpal tunnel release.  I have counseled the patient that endoscopic carpal tunnel release is equally effective compared to open carpal tunnel release, but while it has a shorter recovery time, it does have a higher risk of inadvertent injury to other structures including the motor branch to the thenar muscles.  Damage to this branch can lead to complete paralysis of the intrinsic muscles of the thumb which may require further surgery including nerve repair, and possibly opponensplasty for reconstruction of thumb function.PROCEDURE:  The patient was identified in the preoperative receiving area and the correct surgical site and consent were confirmed; the patient did not receive a regional block by anesthesia and was then brought to the operating room. A timeout was preformed identifying the correct patient, surgical site, procedure performed, allergies, and any special concerns.  General anesthesia was achieved and all lines were placed and secured.  Perioperative  antibiotics were ordered and given.  Veneodynes were used for DVT prophylaxis throughout the case.  After anesthesia was induced,  a pneumatic tourniquet was placed on the upper arm and excluded with a 1000 drape and then the surgical site was then prepped with hibiclens soap and then draped in the standard sterile fashion. Prior to incision a final surgical timeout was performed to verify the correct patient, surgical site and surgical side.Surgical markings were then made to delineate the incision over the right wrist crease just proximal to the palm.  This 1 cm incision was marked  just ulnar to the palmaris brevis tendon.  An approximate line was then drawn down the length of the palm towards the base of the ring finger to delineate the trajectory of carpal tunnel release just radial to the hamate to avoid all neurovascular structures within the carpal tunnel.  A 2nd incision was marked over the A1 pulley of the index finger, there is no naturally occurring skin crease that could be used and therefore a 1.2 cm incision was marked directly over the metacarpal head in line with the 2nd ray.  Total of 10 cc of local anesthetic was then injected into the wrist crease incision and then into the carpal tunnel to anesthetize the carpal tunnel release; this consisted of a 50 50 mixture of 0.5% Marcaine and 1% lidocaine with epinephrine.  The right upper extremity was then exsanguinated with an Esmarch tourniquet and the pneumatic tourniquet was inflated to 250 mm of mercury.  The operation began by incising the wrist crease incision.  The skin edges were then retracted with skin hooks and blunt dissection was carried down to the antecubital fascia.  A transverse incision was then made within the antecubital fascia with a 15 blade.  Ragnell retractors were then used to retract the proximal and distal leaflets of the antecubital fascia and a synovial elevator was introduced into and just deep to the antecubital fascia.  This elevator was then used to bluntly dissect any synovial tissue off of the roof of the carpal tunnel, I was able to feel the washboard texture of the transverse carpal ligament, confirming that a thorough dissection of synovial tissue had been achieved.  A series of curved sounds were then introduced in order to increase the diameter of the entry point for the endoscopic carpal tunnel cannula.  The sounds were used to palpate the radial aspect of the hamate, confirming the ulnar border of the carpal tunnel.  Camera/Release device was introduced through the proximal wrist incision.  The hand was then elevated and then slightly extended in order to provide a direct line trajectory for the came in to advance.  The cannula was then advanced down the line towards the 4th ray and the entirety of the transverse carpal ligament was well visualized.  The cannula was advanced until palmar fat could be appreciated distally, and it was apparent that all synovial tissue had been well dissected off of the transverse carpal ligament with no evidence of neurovascular structures traversing the ligament.The cannulae was then readvanced to the distal extent of the carpal tunnel and the knife was deployed.  The cannula was then retracted approximately 2 cm, the knife was then retracted and can it was advanced confirm release, read applying the knife to lyse any further communications of the transverse carpal ligament.  This was repeated along the entire proximal distal length of the transverse carpal ligament until the entirety of the transverse carpal ligament was well released and palmaris brevis and palmar fascia could be appreciated on the superficial side of the transverse carpal ligament release.  The cannula was then removed from the port site, and a Ragnell retractor was then replaced in order to introduce a synovial elevator to confirm complete release of the transverse carpal ligament.  The elevator was easily able to be passed to the distal extent of the carpal tunnel and confirmation of the release was appreciated as the elevator was able to be passed into the palmar fat superficial to the transverse carpal ligament.  Finally, the Ragnell retractor was  used to retract the wrist incision proximally and a Littler scissor was used to complete the release of the antecubital fascia proximally. The incision was then closed with a single interrupted 4-0 Monocryl in the dermis and dressed with skin glue.I then turned my attention to the right thumb. An incision was made in the MP flexion crease, subcutaneous veins were cauterized with bipolar electrocautery. Blunt dissection was carried down to the flexor tendon sheath. Ragnel retractors were used to retract and protect the neurovascular bundles radially and ulnarly. The flexor tendon sheath was freed of surrounding tissues and then the A1 pulley was incised with a 15 blade along its entire length. The flexor tendon was then retracted and found to be gliding freely. I then turned my attention to the right index. An incision was over the A1 pully , subcutaneous veins were cauterized with bipolar electrocautery. Blunt dissection was carried down to the flexor tendon sheath. Ragnel retractors were used to retract and protect the neurovascular bundles radially and ulnarly. The flexor tendon sheath was freed of surrounding tissues and then the A1 pulley was incised with a 15 blade along its entire length. The flexor tendons were then retracted and found to be gliding freely. The tourniquet was then let down and a soft dressing was applied.  The conclusion of the case all counts were correct.The patient was awoken from MAC anesthesia and transported the PACU in stable condition.  I was present, scrubbed and the primary surgeon for the entirety of this case including all critical components.  POSTOPERATIVE PLAN:  The dry dressing remain in place for 5 days and then can be removed.  During the 1st five days the dressing should remain clean and dry and then can be removed.  Once the dressings down, the incision can get wet, but should not be submerged into remain otherwise dry.  The patient should not engage in heavy activities involving hands, picking up anything more than a gallon milk with the affected hand until the follow-up with me postoperatively in 2 weeks.Kinnie Scales. Avedis Bevis, MDAssistant Professor, Department of SurgeryPlastic and Reconstructive Surgery

## 2021-04-30 ENCOUNTER — Encounter: Admit: 2021-04-30 | Payer: PRIVATE HEALTH INSURANCE | Attending: Plastic Surgery | Primary: Internal Medicine

## 2021-04-30 ENCOUNTER — Ambulatory Visit: Admit: 2021-04-30 | Payer: MEDICAID | Attending: Plastic Surgery | Primary: Internal Medicine

## 2021-04-30 DIAGNOSIS — R112 Nausea with vomiting, unspecified: Secondary | ICD-10-CM

## 2021-04-30 DIAGNOSIS — F419 Anxiety disorder, unspecified: Secondary | ICD-10-CM

## 2021-04-30 DIAGNOSIS — X008XXA Other exposure to uncontrolled fire in building or structure, initial encounter: Secondary | ICD-10-CM

## 2021-04-30 DIAGNOSIS — M65342 Trigger finger, left ring finger: Secondary | ICD-10-CM

## 2021-04-30 DIAGNOSIS — K635 Polyp of colon: Secondary | ICD-10-CM

## 2021-04-30 DIAGNOSIS — E119 Type 2 diabetes mellitus without complications: Secondary | ICD-10-CM

## 2021-04-30 DIAGNOSIS — K219 Gastro-esophageal reflux disease without esophagitis: Secondary | ICD-10-CM

## 2021-04-30 DIAGNOSIS — J309 Allergic rhinitis, unspecified: Secondary | ICD-10-CM

## 2021-04-30 DIAGNOSIS — F4310 Post-traumatic stress disorder, unspecified: Secondary | ICD-10-CM

## 2021-04-30 DIAGNOSIS — U071 COVID-19 virus infection: Secondary | ICD-10-CM

## 2021-04-30 DIAGNOSIS — IMO0002 Cystocele: Secondary | ICD-10-CM

## 2021-04-30 DIAGNOSIS — M4180 Other forms of scoliosis, site unspecified: Secondary | ICD-10-CM

## 2021-04-30 DIAGNOSIS — J45909 Unspecified asthma, uncomplicated: Secondary | ICD-10-CM

## 2021-04-30 DIAGNOSIS — M1990 Unspecified osteoarthritis, unspecified site: Secondary | ICD-10-CM

## 2021-04-30 DIAGNOSIS — D219 Benign neoplasm of connective and other soft tissue, unspecified: Secondary | ICD-10-CM

## 2021-05-01 ENCOUNTER — Encounter: Admit: 2021-05-01 | Payer: PRIVATE HEALTH INSURANCE | Attending: Plastic Surgery | Primary: Internal Medicine

## 2021-05-01 DIAGNOSIS — M4180 Other forms of scoliosis, site unspecified: Secondary | ICD-10-CM

## 2021-05-01 DIAGNOSIS — K219 Gastro-esophageal reflux disease without esophagitis: Secondary | ICD-10-CM

## 2021-05-01 DIAGNOSIS — X008XXA Other exposure to uncontrolled fire in building or structure, initial encounter: Secondary | ICD-10-CM

## 2021-05-01 DIAGNOSIS — IMO0002 Cystocele: Secondary | ICD-10-CM

## 2021-05-01 DIAGNOSIS — R112 Nausea with vomiting, unspecified: Secondary | ICD-10-CM

## 2021-05-01 DIAGNOSIS — E119 Type 2 diabetes mellitus without complications: Secondary | ICD-10-CM

## 2021-05-01 DIAGNOSIS — J45909 Unspecified asthma, uncomplicated: Secondary | ICD-10-CM

## 2021-05-01 DIAGNOSIS — F4310 Post-traumatic stress disorder, unspecified: Secondary | ICD-10-CM

## 2021-05-01 DIAGNOSIS — J309 Allergic rhinitis, unspecified: Secondary | ICD-10-CM

## 2021-05-01 DIAGNOSIS — K635 Polyp of colon: Secondary | ICD-10-CM

## 2021-05-01 DIAGNOSIS — M1990 Unspecified osteoarthritis, unspecified site: Secondary | ICD-10-CM

## 2021-05-01 DIAGNOSIS — U071 COVID-19 virus infection: Secondary | ICD-10-CM

## 2021-05-01 DIAGNOSIS — D219 Benign neoplasm of connective and other soft tissue, unspecified: Secondary | ICD-10-CM

## 2021-05-01 DIAGNOSIS — F419 Anxiety disorder, unspecified: Secondary | ICD-10-CM

## 2021-05-01 NOTE — Progress Notes
Plastic Surgery ClinicSeptember 28, 2022					Patient name: Lynn Reed MRN:      [id number] Date of birth:   1962-03-29Date of visit:    9/28/2022Provider:	Egor Fullilove M Milagro Belmares, PAPOST OPERATIVE VISITDate of Surgery: 9/12/22Surgeon: Onalee Hua ColenProcedure: Right endoscopic carpal tunnel release and thumb and index finger release Lynn Reed is now 2 weeks s/p above procedure.Since her surgery she has had some issues with ROMPt is slowly improvingReview of Systems:Denies fever, chills, CP, SOB, cough, sore throat, peripheral edema, nausea, vomiting, rash, and tingling. Physical Exam:Alert, oriented x 3 Well developed, well nourished, NADHENT:  Normal cephalic, atraumaticNeck:  Supple, normal ROMCardiac:  Normal rate, no peripheral edemaPulmonary:  Normal breathing effort, no accessory muscle useSkin: Warm and dryPsychiatric: Normal mood and affectExam:   Her sutures were intact and removed.  The incision appears infection free and healing wellMild stiffness Sensation intact to light touch Imaging:  noneTherapy:  Pt is scheduled to start ]hadn therapy next weekPlan and Follow-up: Lynn Reed is recovering from surgery. She will keep incisions clean and dryFollow up after completing hand therapyPatient will contact the office if any problems present in the interim.[name] M Mangino9/28/2022 4:34 PM

## 2021-05-08 ENCOUNTER — Inpatient Hospital Stay: Admit: 2021-05-08 | Discharge: 2021-05-08 | Payer: MEDICAID | Primary: Internal Medicine

## 2021-05-08 DIAGNOSIS — M65312 Trigger thumb, left thumb: Secondary | ICD-10-CM

## 2021-05-08 DIAGNOSIS — Z9889 Other specified postprocedural states: Secondary | ICD-10-CM

## 2021-05-08 DIAGNOSIS — M79642 Pain in left hand: Secondary | ICD-10-CM
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
## 2021-05-13 ENCOUNTER — Inpatient Hospital Stay: Admit: 2021-05-13 | Discharge: 2021-05-13 | Payer: MEDICAID | Primary: Internal Medicine

## 2021-05-13 DIAGNOSIS — M25649 Stiffness of unspecified hand, not elsewhere classified: Secondary | ICD-10-CM

## 2021-05-13 DIAGNOSIS — M79642 Pain in left hand: Secondary | ICD-10-CM

## 2021-05-13 DIAGNOSIS — M65312 Trigger thumb, left thumb: Secondary | ICD-10-CM

## 2021-05-13 NOTE — Other
REHABILITATION SERVICES AT [facility] Services At [facility] [phone] [address]Ham[address] [place] [address]WJXBJ Number: [phone] Number: [phone] Therapy Daily NotePatient Name:  Lynn Reed Record Number:  [id number] Date of Birth:  1962-09-21Therapist:  Hamilton Reed, OTRReferring Provider:  Genia Del Mangino, PAICD-10 Diagnosis(es):Problem List         ICD-10-CM   OT - L Hand Pain  Left hand pain M79.642   03/21/2018 [facility]     Major depressive disorder, recurrent episode, mild degree (HC CODE) (HC Code) F33.0  Acute posttraumatic stress disorder F43.11  General InformationTherapy Episode of Care  Date of Visit:   05/13/2021  Treatment Number:   2  Start of Care Date:   05/08/2021  Date of Surgery:   9/12/2022Precautions/Limitations   Precautions/Limitations:  Standard precautionsNew Neurological Deficit   Did the patient have a new neurological deficit as evidenced by diminished muscle strength   of less than 3 at any time during the 90 days after spine surgery:  N/ACognition / Learning Assessment   Primary Learner Relationship:  Patient        Barriers to learning:  No barriers        Preferred language:  English        Preferred learning style:  Listening and DemonstrationI reviewed the Patient Care Agreement and Attendance Form with the Patient/Family.  The Patient/Family verbalized understanding.Medication Review:Current Outpatient Medications Medication Sig ? albuterol sulfate Inhale 2 puffs into the lungs every 6 (six) hours as needed for wheezing. ? atorvastatin Take 1 tablet (20 mg total) by mouth daily. (Patient not taking: No sig reported) ? blood sugar diagnostic TrueMetrix. Check glucose once daily for E11.9 ? Advair HFA Inhale 1 puff into the lungs 2 (two) times daily. After use, rinse mouth with water. ? fluticasone propionate Use 1 spray in each nostril daily. ? hydrOXYzine Take 1 tablet (10 mg total) by mouth daily as needed for anxiety. ? ibuprofen Take 1 tablet (600 mg total) by mouth every 6 (six) hours as needed. ? ketamine/doxepin/nifedipine/pentoxifylline: 10-5-8-8% topical (compound) Apply topically up to four times daily as needed to feet for symptoms of burning, electricity, and tingling. ? lancets TrueMetrix. Check glucose once daily for E11.9 ? lancing device with lancets Check blood sugar 1 time daily ? levocetirizine Take 1 tablet (5 mg total) by mouth every evening before dinner. For allergies ? pantoprazole Take 1 tablet (40 mg total) by mouth 2 (two) times daily before breakfast and dinner. 30 minutes prior to eating (Patient not taking: No sig reported) ? psyllium Take 1 packet by mouth daily. (Patient not taking: No sig reported) ? SITagliptin Take 1 tablet (100 mg total) by mouth daily. ? triamcinolone Apply topically 2 (two) times daily. SubjectivePatient reports that she is doing her exercises.  She states that she tries to eat with her right hand but it is difficult so she tends to switch to the left side.ObjectivePatient observed maintain her right hand in a protected posture she enters the clinic.  Band-Aid on thumb MCP crease increments surgery still present on both incision sites in the hand though patient reports that she has been washing her hand with soap and water.Initial attempts at composite fisting demonstrated a 4 cm deficit.  After heat and stretch and Education patient was able to bring all of her fingertips of the palm, lacking 3 cm to the distal palmar crease in II, 2 2 cm to distal palmar crease in III, and 0.5 cm to [facility] in IV, VThumb opposition to V PIP with  effortTreatment Provided This VisitCPT Code Interventions Timed Minutes Untimed Minutes Total Minutes Heat - Ice Pack  Moist heat applied with ample toweling and frequent skin checksInitially with digits extended, then in composite fist  8  Manual Therapy (97140) Debridement of dense dead skin to both incision sites in the hand.  Soft tissue mobilization to facilitate extrinsic extensor excursion with tendon glides, scar massage to scar at volar wrist crease with Education to begin scar massage with cocoa butter to the hand incisions 2-3x/day  15  15 Therapeutic Activity (97530) Tendon glides for extension, hook fist and composite fist with cues to initiate the motion with DIP flexion-Bilateral overhead fisting with simultaneous thumb flexion x 10 with Education to try this at home to minimize edema and facilitate subcortical fisting on the rightOpposition with radial extension x10 In-hand manipulation with mini connect for a meeting for perfect circles with thumb and each finger tip than radial extension of the thumb and digit extension x10In-hand manipulation to feed pegs in and out of hand with mini connect 4 x10Twist wrist stick light tension x10Provided with yellow sponge and engaged in 10 repetitions of composite fist and then spoke proprioceptive training to spin the sponge prior to gripping againEncouraged to purchase Isotoner glove for night use and provided with a Tubigrip sleeve to help decrease edema at nightProvided with a built-up foam to put over her fork and encouraged to eat with her right hand only and d/c the tube as sooner she is able to manipulate the fork without it 20  20 N/A       Total Treatment Time: 35 AssessmentPatient appeared much more confident and demonstrated much better soft tissue mobility after today's session she was receptive to education and program.PlanFrequency:  2x per weekPlan for Next VisitROM, functional activities, dexterity, upgrade hep. Address patient's questions and concernsNo medication changes.Electronically signed by Monia Sabal. Casimiro Needle, OTD, OTR/L, CHT 05/13/2021

## 2021-05-15 ENCOUNTER — Inpatient Hospital Stay: Admit: 2021-05-15 | Discharge: 2021-05-15 | Payer: MEDICAID | Primary: Internal Medicine

## 2021-05-15 DIAGNOSIS — Z9889 Other specified postprocedural states: Secondary | ICD-10-CM

## 2021-05-15 DIAGNOSIS — M79642 Pain in left hand: Secondary | ICD-10-CM

## 2021-05-15 NOTE — Other
[facility] AT [facility] [place] [phone] [place] [address]LKGMW Number: [phone] Number: [phone] Therapy Daily NotePatient Name:  Lynn Reed Record Number:  [id number] Date of Birth:  29-Dec-1962Therapist:  Philmore Pali, OTR/L, CLTReferring Provider:  Genia Del Mangino, PAICD-10 Diagnosis(es):Problem List         ICD-10-CM   OT - L Hand Pain  Left hand pain M79.642   03/21/2018 [facility]     Major depressive disorder, recurrent episode, mild degree (HC CODE) (HC Code) F33.0  Acute posttraumatic stress disorder F43.11  General InformationTherapy Episode of Care  Date of Visit:   05/15/2021  Treatment Number:   3  Date the Treatment Plan was Initiated/Reviewed:  05/08/2021  Start of Care Date:   05/08/2021  Date of Surgery:   04/14/2021 (L thumb and IF trigger finger release and CTR)  Progress Report Due Date:   06/03/2021  MD Order Renewal Date:   12/1/2022Precautions/Limitations   Precautions/Limitations:  Standard precautionsNew Neurological Deficit   Did the patient have a new neurological deficit as evidenced by diminished muscle strength   of less than 3 at any time during the 90 days after spine surgery:  N/ACognition / Learning Assessment   Primary Learner Relationship:  Patient        Barriers to learning:  No barriers        Preferred language:  English        Preferred learning style:  Listening and DemonstrationI reviewed the Patient Care Agreement and Attendance Form with the Patient/Family.  The Patient/Family verbalized understanding.Medication Review:Current Outpatient Medications Medication Sig ? albuterol sulfate Inhale 2 puffs into the lungs every 6 (six) hours as needed for wheezing. ? atorvastatin Take 1 tablet (20 mg total) by mouth daily. (Patient not taking: No sig reported) ? blood sugar diagnostic TrueMetrix. Check glucose once daily for E11.9 ? Advair HFA Inhale 1 puff into the lungs 2 (two) times daily. After use, rinse mouth with water. ? fluticasone propionate Use 1 spray in each nostril daily. ? hydrOXYzine Take 1 tablet (10 mg total) by mouth daily as needed for anxiety. ? ibuprofen Take 1 tablet (600 mg total) by mouth every 6 (six) hours as needed. ? ketamine/doxepin/nifedipine/pentoxifylline: 10-5-8-8% topical (compound) Apply topically up to four times daily as needed to feet for symptoms of burning, electricity, and tingling. ? lancets TrueMetrix. Check glucose once daily for E11.9 ? lancing device with lancets Check blood sugar 1 time daily ? levocetirizine Take 1 tablet (5 mg total) by mouth every evening before dinner. For allergies ? pantoprazole Take 1 tablet (40 mg total) by mouth 2 (two) times daily before breakfast and dinner. 30 minutes prior to eating (Patient not taking: No sig reported) ? psyllium Take 1 packet by mouth daily. (Patient not taking: No sig reported) ? SITagliptin Take 1 tablet (100 mg total) by mouth daily. ? triamcinolone Apply topically 2 (two) times daily. SubjectivePatient reports that she is doing her exercises.  She reports the activities in clinic are getting easier for her. She is only using heat at home for her hand.ObjectivePatient observed with less of a protected posture of her right hand, as she enters the clinic.  Initial attempts at composite fisting demonstrated a 4 cm deficit.  Less necrotic dry skin at incisions (see previous clinician's note), no drainage, nor weeping, nor dressings or Band-Aid. Treatment Provided This VisitCPT Code Interventions Timed Minutes Untimed Minutes Total Minutes Heat - Ice Pack  Moist heat applied with ample toweling and frequent skin  checksInitially with digits extended,   5 5 Manual Therapy (97140) STM/ Soft tissue mobilization to facilitate extrinsic extensor excursion with tendon glides, scar massage to scar at volar wrist crease with reminder to patient to perform scar massage with cocoa butter to the hand incisions 2-3x/day. Pt reports she is doing this. 10  10 Therapeutic Activity (97530) Tendon glides for extension, hook fist and composite fist with cues to initiate the motion with DIP flexion, as pt shows preference for flat fist and less DIP motion without cues.In-hand manipulation with mini connect for a meeting for perfect circles with thumb and each finger tip than radial extension of the thumb and digit extension x10In-hand manipulation to feed discs in and out of hand with mini connect 4 x10, then foam pieces, varying sizes, for same. Provided pt with education to monitor wrist position (netural to extended) when doing tendon glides to facilitate digital flexion.   Gentle towel scrunches with fair return demo after two full trials. Pt instructed to not force the grasp but rather use activity as gentle warm up to more functional activities. 20  20 N/A       Total Treatment Time: 35 AssessmentPatient appeared much more relaxed and less cautious with using her right hand functionally. Less guarding than previous sessions. Patient is very receptive to education and suggestions.PlanFrequency:  2x per weekPlan for Next VisitROM, functional activities, dexterity, upgrade hep. Address patient's questions and concernsNo medication changes.Electronically signed by Philmore Pali, OTR/L, CLT 10/13/2022[facility]

## 2021-05-22 ENCOUNTER — Inpatient Hospital Stay: Admit: 2021-05-22 | Discharge: 2021-05-22 | Payer: MEDICAID | Primary: Internal Medicine

## 2021-05-22 DIAGNOSIS — G5601 Carpal tunnel syndrome, right upper limb: Secondary | ICD-10-CM

## 2021-05-22 DIAGNOSIS — M79642 Pain in left hand: Secondary | ICD-10-CM

## 2021-05-22 DIAGNOSIS — M25649 Stiffness of unspecified hand, not elsewhere classified: Secondary | ICD-10-CM

## 2021-05-22 DIAGNOSIS — M65312 Trigger thumb, left thumb: Secondary | ICD-10-CM

## 2021-05-22 NOTE — Other
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  Acute posttraumatic stress disorder F43.11  General InformationTherapy Episode of Care  Date of Visit:   05/22/2021  Treatment Number:   4  Date the Treatment Plan was Initiated/Reviewed:  05/08/2021  Start of Care Date:   05/08/2021  Date of Surgery:   04/14/2021 (L thumb and IF trigger finger release and CTR)  Progress Report Due Date:   06/03/2021  MD Order Renewal Date:   12/1/2022Precautions/Limitations   Precautions/Limitations:  Standard precautionsNew Neurological Deficit   Did the patient have a new neurological deficit as evidenced by diminished muscle strength   of less than 3 at any time during the 90 days after spine surgery:  N/ACognition / Learning Assessment   Primary Learner Relationship:  Patient        Barriers to learning:  No barriers        Preferred language:  English        Preferred learning style:  Listening and DemonstrationI reviewed the Patient Care Agreement and Attendance Form with the Patient/Family.  The Patient/Family verbalized understanding.Medication Review:Current Outpatient Medications Medication Sig ? albuterol sulfate Inhale 2 puffs into the lungs every 6 (six) hours as needed for wheezing. ? atorvastatin Take 1 tablet (20 mg total) by mouth daily. (Patient not taking: No sig reported) ? blood sugar diagnostic TrueMetrix. Check glucose once daily for E11.9 ? Advair HFA Inhale 1 puff into the lungs 2 (two) times daily. After use, rinse mouth with water. ? fluticasone propionate Use 1 spray in each nostril daily. ? hydrOXYzine Take 1 tablet (10 mg total) by mouth daily as needed for anxiety. ? ibuprofen Take 1 tablet (600 mg total) by mouth every 6 (six) hours as needed. ? ketamine/doxepin/nifedipine/pentoxifylline: 10-5-8-8% topical (compound) Apply topically up to four times daily as needed to feet for symptoms of burning, electricity, and tingling. ? lancets TrueMetrix. Check glucose once daily for E11.9 ? lancing device with lancets Check blood sugar 1 time daily ? levocetirizine Take 1 tablet (5 mg total) by mouth every evening before dinner. For allergies ? pantoprazole Take 1 tablet (40 mg total) by mouth 2 (two) times daily before breakfast and dinner. 30 minutes prior to eating (Patient not taking: No sig reported) ? psyllium Take 1 packet by mouth daily. (Patient not taking: No sig reported) ? SITagliptin phosphate Take 1 tablet (100 mg total) by mouth daily. ? triamcinolone Apply topically 2 (two) times daily. SubjectiveEvery night my thumb and hand look swollen. Even when they are not during the day..ObjectivePatient observed with less of a protected posture of her right hand, as she enters the clinic, but still not performing spontaneous motions like holding papers..  Initial attempts at composite fisting demonstrated a 2.5 cm deficit.  Less necrotic dry skin at incisions but adherence scar tissue present mostly thumb and IF.Treatment Provided This VisitCPT Code Interventions Timed Minutes Untimed Minutes Total Minutes Heat - Ice Pack  NA    Manual Therapy (97140) STM/ Soft tissue mobilization to facilitate extrinsic extensor excursion with  tendon glides, scar massage to scar at volar wrist crease, thumb and IF base in volar palm. Again with reminder to patient to perform scar massage with cocoa butter to the hand incisions 2-3x/day. Pt reports she is doing this, but less firmly than recommended. Issued compressive stockinette 92) for night time wear with pt educated on signs to remove if present. 15  15 Therapeutic Activity (97530) In-hand manipulation with mini connect for a meeting for perfect circles with thumb and each finger tip than radial extension of the thumb and digit extension x10In-hand manipulation to feed discs in and out of hand with mini connect 4 x10, then foam pieces, varying sizes, for same. Gentle towel scrunches with fair return demo after two full trials. Pt instructed to not force the grasp but rather use activity as gentle warm up to more functional activities. 15  15 N/A       Total Treatment Time: 30 AssessmentPatient mking progress with MT for self program for edema, and advancing with her scar mobilization. Composite fist is much imrpoved with residual tightness in IF end range flexion, and SF opposition giving mild pain, per pt.PlanFrequency:  2x per weekPlan for Next VisitROM, functional activities, dexterity, upgrade hep. Address patient's questions and concernsNo medication changes.Electronically signed by Philmore Pali, OTR/L, CLT 10/20/2022[facility]

## 2021-05-26 ENCOUNTER — Inpatient Hospital Stay: Admit: 2021-05-26 | Discharge: 2021-05-26 | Payer: MEDICAID | Primary: Internal Medicine

## 2021-05-26 DIAGNOSIS — G5601 Carpal tunnel syndrome, right upper limb: Secondary | ICD-10-CM

## 2021-05-26 DIAGNOSIS — M79642 Pain in left hand: Secondary | ICD-10-CM

## 2021-05-26 NOTE — Other
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  Acute posttraumatic stress disorder F43.11  General InformationTherapy Episode of Care  Date of Visit:   05/26/2021  Treatment Number:   5  Start of Care Date:   05/08/2021  Date of Surgery:   9/12/2022Precautions/Limitations   Precautions/Limitations:  Standard precautionsNew Neurological Deficit   Did the patient have a new neurological deficit as evidenced by diminished muscle strength   of less than 3 at any time during the 90 days after spine surgery:  N/ACognition / Learning Assessment   Primary Learner Relationship:  Patient        Barriers to learning:  No barriers        Preferred language:  English        Preferred learning style:  Listening and DemonstrationI reviewed the Patient Care Agreement and Attendance Form with the Patient/Family.  The Patient/Family verbalized understanding.Medication Review:Current Outpatient Medications Medication Sig ? albuterol sulfate Inhale 2 puffs into the lungs every 6 (six) hours as needed for wheezing. ? atorvastatin Take 1 tablet (20 mg total) by mouth daily. (Patient not taking: No sig reported) ? blood sugar diagnostic TrueMetrix. Check glucose once daily for E11.9 ? Advair HFA Inhale 1 puff into the lungs 2 (two) times daily. After use, rinse mouth with water. ? fluticasone propionate Use 1 spray in each nostril daily. ? hydrOXYzine Take 1 tablet (10 mg total) by mouth daily as needed for anxiety. ? ibuprofen Take 1 tablet (600 mg total) by mouth every 6 (six) hours as needed. ? ketamine/doxepin/nifedipine/pentoxifylline: 10-5-8-8% topical (compound) Apply topically up to four times daily as needed to feet for symptoms of burning, electricity, and tingling. ? lancets TrueMetrix. Check glucose once daily for E11.9 ? lancing device with lancets Check blood sugar 1 time daily ? levocetirizine Take 1 tablet (5 mg total) by mouth every evening before dinner. For allergies ? pantoprazole Take 1 tablet (40 mg total) by mouth 2 (two) times daily before breakfast and dinner. 30 minutes prior to eating (Patient not taking: No sig reported) ? psyllium Take 1 packet by mouth daily. (Patient not taking: No sig reported) ? SITagliptin phosphate Take 1 tablet (100 mg total) by mouth daily. ? triamcinolone Apply topically 2 (two) times daily. SubjectivePatient reports that she is doing her exercises.  She states that she is still stiff and her finger gets stuck in extension at night.ObjectivePatient observed to initiate a fist with MP flexion and utilize an impaired motor plan when attempting to flex to full composite fist.Initial attempts at composite fisting demonstrated a 4 cm deficit.  After heat and stretch and Education patient was able to bring all of her fingertips of the palm, lacking 3 cm to the distal palmar crease in II, 2 2 cm to distal palmar crease in III, and 0.5 cm to [facility] in IV, VThumb opposition to V PIP with effortTreatment Provided This VisitCPT Code Interventions Timed Minutes Untimed Minutes Total Minutes Heat - Ice Pack  Moist heat applied with ample toweling and frequent skin checksInitially with digits  extended, then in composite fist  8  Manual Therapy (97140) IASTM in palm and dorsal hand with fingers and wrist in various positions to facilitate range of motion 10  10 Therapeutic Activity (97530) Tendon glides for extension, hook fist and composite fist with cues to initiate the motion with DIP flexion- engaged in mirror training for cortical remote being to facilitate normal movement patterns however patient demonstrated abnormal movement patterns with her left hand during this task as well .Opposition with radial extension x10 Twist wrist stick light tension x10 15  15 Ultrasound (16109) Ultrasound at 0.8 w/cm continuous for 8 minutes prior to manual treatment to improve circulation and tissue extensibility.To scars at index finger and thumb prior to range of motion 6  6   Total Treatment Time: 31 AssessmentPatient demonstrated much better soft tissue mobility after today's session she was receptive to education and program.  She does better with functional activities than with deliberate tendon glidesPlanFrequency:  2x per weekPlan for Next VisitROM, functional activities, dexterity, upgrade hep. Address patient's questions and concernsNo medication changes.Electronically signed by Monia Sabal. Casimiro Needle, OTD, OTR/L, CHT 05/26/2021

## 2021-05-28 ENCOUNTER — Encounter: Admit: 2021-05-28 | Payer: PRIVATE HEALTH INSURANCE | Attending: Plastic Surgery | Primary: Internal Medicine

## 2021-05-28 ENCOUNTER — Ambulatory Visit: Admit: 2021-05-28 | Payer: MEDICAID | Attending: Plastic Surgery | Primary: Internal Medicine

## 2021-05-28 DIAGNOSIS — J45909 Unspecified asthma, uncomplicated: Secondary | ICD-10-CM

## 2021-05-28 DIAGNOSIS — K635 Polyp of colon: Secondary | ICD-10-CM

## 2021-05-28 DIAGNOSIS — E119 Type 2 diabetes mellitus without complications: Secondary | ICD-10-CM

## 2021-05-28 DIAGNOSIS — F419 Anxiety disorder, unspecified: Secondary | ICD-10-CM

## 2021-05-28 DIAGNOSIS — K219 Gastro-esophageal reflux disease without esophagitis: Secondary | ICD-10-CM

## 2021-05-28 DIAGNOSIS — X008XXA Other exposure to uncontrolled fire in building or structure, initial encounter: Secondary | ICD-10-CM

## 2021-05-28 DIAGNOSIS — F4310 Post-traumatic stress disorder, unspecified: Secondary | ICD-10-CM

## 2021-05-28 DIAGNOSIS — U071 COVID-19 virus infection: Secondary | ICD-10-CM

## 2021-05-28 DIAGNOSIS — Z9889 Other specified postprocedural states: Secondary | ICD-10-CM

## 2021-05-28 DIAGNOSIS — IMO0002 Cystocele: Secondary | ICD-10-CM

## 2021-05-28 DIAGNOSIS — J309 Allergic rhinitis, unspecified: Secondary | ICD-10-CM

## 2021-05-28 DIAGNOSIS — M4180 Other forms of scoliosis, site unspecified: Secondary | ICD-10-CM

## 2021-05-28 DIAGNOSIS — M1990 Unspecified osteoarthritis, unspecified site: Secondary | ICD-10-CM

## 2021-05-28 DIAGNOSIS — R112 Nausea with vomiting, unspecified: Secondary | ICD-10-CM

## 2021-05-28 DIAGNOSIS — D219 Benign neoplasm of connective and other soft tissue, unspecified: Secondary | ICD-10-CM

## 2021-05-29 ENCOUNTER — Encounter: Admit: 2021-05-29 | Payer: PRIVATE HEALTH INSURANCE | Attending: Plastic Surgery | Primary: Internal Medicine

## 2021-05-29 ENCOUNTER — Inpatient Hospital Stay: Admit: 2021-05-29 | Discharge: 2021-05-29 | Payer: MEDICAID | Primary: Internal Medicine

## 2021-05-29 DIAGNOSIS — J45909 Unspecified asthma, uncomplicated: Secondary | ICD-10-CM

## 2021-05-29 DIAGNOSIS — R112 Nausea with vomiting, unspecified: Secondary | ICD-10-CM

## 2021-05-29 DIAGNOSIS — M4180 Other forms of scoliosis, site unspecified: Secondary | ICD-10-CM

## 2021-05-29 DIAGNOSIS — F419 Anxiety disorder, unspecified: Secondary | ICD-10-CM

## 2021-05-29 DIAGNOSIS — M1990 Unspecified osteoarthritis, unspecified site: Secondary | ICD-10-CM

## 2021-05-29 DIAGNOSIS — J309 Allergic rhinitis, unspecified: Secondary | ICD-10-CM

## 2021-05-29 DIAGNOSIS — G5601 Carpal tunnel syndrome, right upper limb: Secondary | ICD-10-CM

## 2021-05-29 DIAGNOSIS — K635 Polyp of colon: Secondary | ICD-10-CM

## 2021-05-29 DIAGNOSIS — Z9889 Other specified postprocedural states: Secondary | ICD-10-CM

## 2021-05-29 DIAGNOSIS — F4310 Post-traumatic stress disorder, unspecified: Secondary | ICD-10-CM

## 2021-05-29 DIAGNOSIS — D219 Benign neoplasm of connective and other soft tissue, unspecified: Secondary | ICD-10-CM

## 2021-05-29 DIAGNOSIS — IMO0002 Cystocele: Secondary | ICD-10-CM

## 2021-05-29 DIAGNOSIS — M79642 Pain in left hand: Secondary | ICD-10-CM

## 2021-05-29 DIAGNOSIS — E119 Type 2 diabetes mellitus without complications: Secondary | ICD-10-CM

## 2021-05-29 DIAGNOSIS — M25649 Stiffness of unspecified hand, not elsewhere classified: Secondary | ICD-10-CM

## 2021-05-29 DIAGNOSIS — X008XXA Other exposure to uncontrolled fire in building or structure, initial encounter: Secondary | ICD-10-CM

## 2021-05-29 DIAGNOSIS — K219 Gastro-esophageal reflux disease without esophagitis: Secondary | ICD-10-CM

## 2021-05-29 DIAGNOSIS — U071 COVID-19 virus infection: Secondary | ICD-10-CM

## 2021-05-29 NOTE — Progress Notes
[facility] Surgery ClinicOctober 26, 2022					Patient name: Lynn Reed MRN:      [id number] Date of birth:   [birth year]Date of visit:    10/26/2022Provider:	Jandel Patriarca M Jamonica Schoff, PAPOST OPERATIVE VISITDate of Surgery: 9/12/22Surgeon: Nathen May Procedure:  Right endoscopic open carpal tunnel release, thumb trigger release, index finger trigger releaseMs. Reed is now 8 weeks s/p above procedure.Since her surgery she has had some issues with ROMShe is attending therapy and she is slowing improvingReview of Systems:Denies fever, chills, CP, SOB, cough, sore throat, peripheral edema, nausea, vomiting, rash, and tingling. Physical Exam:Alert, oriented x 3 Well developed, well nourished, NADHENT:  Normal cephalic, atraumaticNeck:  Supple, normal ROMCardiac:  Normal rate, no peripheral edemaPulmonary:  Normal breathing effort, no accessory muscle useSkin: Warm and dryPsychiatric: Normal mood and affectExam:   The incision appears fully healedMild stiffnessTherapy:  Pt will continue hand therapyPlan and Follow-up: Lynn Reed is recovering from surgery.She will f/u after completing therapyPatient will contact the office if any problems present in the interiRafaela M Mangino10/26/2022 1:53 PM

## 2021-05-29 NOTE — Other
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  Acute posttraumatic stress disorder F43.11  General InformationTherapy Episode of Care  Date of Visit:   05/29/2021  Treatment Number:   6  Date the Treatment Plan was Initiated/Reviewed:  05/08/2021  Start of Care Date:   05/08/2021  Date of Surgery:   04/14/2021 (L thumb and IF trigger finger release and CTR))  Progress Report Due Date:   06/03/2021  MD Order Renewal Date:   12/1/2022Precautions/Limitations   Precautions/Limitations:  Standard precautionsNew Neurological Deficit   Did the patient have a new neurological deficit as evidenced by diminished muscle strength   of less than 3 at any time during the 90 days after spine surgery:  N/ACognition / Learning Assessment   Primary Learner Relationship:  Patient        Barriers to learning:  No barriers        Preferred language:  English        Preferred learning style:  Listening and DemonstrationI reviewed the Patient Care Agreement and Attendance Form with the Patient/Family.  The Patient/Family verbalized understanding.Medication Review:Current Outpatient Medications Medication Sig ? albuterol sulfate Inhale 2 puffs into the lungs every 6 (six) hours as needed for wheezing. ? atorvastatin Take 1 tablet (20 mg total) by mouth daily. (Patient not taking: Reported on 05/28/2021) ? blood sugar diagnostic TrueMetrix. Check glucose once daily for E11.9 ? Advair HFA Inhale 1 puff into the lungs 2 (two) times daily. After use, rinse mouth with water. ? fluticasone propionate Use 1 spray in each nostril daily. ? hydrOXYzine Take 1 tablet (10 mg total) by mouth daily as needed for anxiety. ? ibuprofen Take 1 tablet (600 mg total) by mouth every 6 (six) hours as needed. ? ketamine/doxepin/nifedipine/pentoxifylline: 10-5-8-8% topical (compound) Apply topically up to four times daily as needed to feet for symptoms of burning, electricity, and tingling. ? lancets TrueMetrix. Check glucose once daily for E11.9 ? lancing device with lancets Check blood sugar 1 time daily ? levocetirizine Take 1 tablet (5 mg total) by mouth every evening before dinner. For allergies ? pantoprazole Take 1 tablet (40 mg total) by mouth 2 (two) times daily before breakfast and dinner. 30 minutes prior to eating ? psyllium Take 1 packet by mouth daily. ? SITagliptin phosphate Take 1 tablet (100 mg total) by mouth daily. ? triamcinolone Apply topically 2 (two) times daily. SubjectiveIt still gets sore and stiff at night. The doctor said it's because I have diabetes and I had 3 surgeries. That's a lot,ObjectivePatient observed to initiate a fist with MP flexion and utilize an impaired motor plan when attempting to flex to full composite fist.With composite fist, after hook to full composite fist, pt lacking 1.5cm to the distal palmar crease in IF. S/P MT and cold laser, pt was 0.8 cm.Thumb opposition to V PIP with effortTreatment Provided This VisitCPT Code Interventions Timed Minutes Untimed Minutes Total Minutes Heat - Ice Pack      Manual Therapy (97140) IASTM in palm, and IF base at A1 pulley. Also use  of silicone dynamic cup for low load prolonged stretch to scar tissue. Cold laser/LLLT to volar IF scar. Palpable softness with more movement in tissue noted. Skin intact pre and post 20  20 Therapeutic Activity (97530) Tendon glides for extension, hook fist and composite fist with cues to initiate the motion with DIP flexion- Opposition with radial extension x10  10  10 Ultrasound (16109)       Total Treatment Time: 30 AssessmentPatient demonstrated much better soft tissue mobility after today's session she was receptive to education and program.  PlanFrequency:  2x per weekPlan for Next VisitROM, functional activities, dexterity, upgrade hep. Address patient's questions and concernsNo medication changes.Electronically signed by Philmore Pali, OTR/L, CLT 10/27/2022[facility]

## 2021-06-02 DIAGNOSIS — Z9889 Other specified postprocedural states: Secondary | ICD-10-CM

## 2021-06-02 DIAGNOSIS — M65312 Trigger thumb, left thumb: Secondary | ICD-10-CM

## 2021-06-02 DIAGNOSIS — M25649 Stiffness of unspecified hand, not elsewhere classified: Secondary | ICD-10-CM

## 2021-06-02 DIAGNOSIS — G5601 Carpal tunnel syndrome, right upper limb: Secondary | ICD-10-CM

## 2021-06-03 ENCOUNTER — Inpatient Hospital Stay: Admit: 2021-06-03 | Discharge: 2021-06-03 | Payer: MEDICAID | Primary: Internal Medicine

## 2021-06-03 DIAGNOSIS — G5601 Carpal tunnel syndrome, right upper limb: Secondary | ICD-10-CM

## 2021-06-03 DIAGNOSIS — M79642 Pain in left hand: Secondary | ICD-10-CM

## 2021-06-04 NOTE — Other
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  Acute posttraumatic stress disorder F43.11  General InformationTherapy Episode of Care  Date of Visit:   06/03/2021  Treatment Number:   7  Date the Treatment Plan was Initiated/Reviewed:  05/08/2021  Start of Care Date:   05/08/2021  Date of Surgery:   04/14/2021  Progress Report Due Date:   06/03/2021  MD Order Renewal Date:   12/1/2022Precautions/Limitations   Precautions/Limitations:  Standard precautionsNew Neurological Deficit   Did the patient have a new neurological deficit as evidenced by diminished muscle strength   of less than 3 at any time during the 90 days after spine surgery:  N/ACognition / Learning Assessment   Primary Learner Relationship:  Patient        Barriers to learning:  No barriers        Preferred language:  English        Preferred learning style:  Listening and DemonstrationI reviewed the Patient Care Agreement and Attendance Form with the Patient/Family.  The Patient/Family verbalized understanding.Medication Review:Current Outpatient Medications Medication Sig ? albuterol sulfate Inhale 2 puffs into the lungs every 6 (six) hours as needed for wheezing. ? atorvastatin Take 1 tablet (20 mg total) by mouth daily. (Patient not taking: Reported on 05/28/2021) ? blood sugar diagnostic TrueMetrix. Check glucose once daily for E11.9 ? Advair HFA Inhale 1 puff into the lungs 2 (two) times daily. After use, rinse mouth with water. ? fluticasone propionate Use 1 spray in each nostril daily. ? hydrOXYzine Take 1 tablet (10 mg total) by mouth daily as needed for anxiety. ? ibuprofen Take 1 tablet (600 mg total) by mouth every 6 (six) hours as needed. ? ketamine/doxepin/nifedipine/pentoxifylline: 10-5-8-8% topical (compound) Apply topically up to four times daily as needed to feet for symptoms of burning, electricity, and tingling. ? lancets TrueMetrix. Check glucose once daily for E11.9 ? lancing device with lancets Check blood sugar 1 time daily ? levocetirizine Take 1 tablet (5 mg total) by mouth every evening before dinner. For allergies ? pantoprazole Take 1 tablet (40 mg total) by mouth 2 (two) times daily before breakfast and dinner. 30 minutes prior to eating ? psyllium Take 1 packet by mouth daily. ? SITagliptin phosphate Take 1 tablet (100 mg total) by mouth daily. ? triamcinolone Apply topically 2 (two) times daily. SubjectivePt reports of Right hand numbness & tingling. Covering therapist; patient seen for the 1st time; progress note deferred to next therapy session.ObjectivePain: 3-4/10 Tip to DPC:IF: 2 cmMF: 2.5 cmRF: 2.5 cmSF: 2.5 cmTreatment Provided This VisitCPT Code Interventions Timed Minutes Untimed Minutes Total Minutes Heat - Ice Pack  MHP to the Right hand to increase muscular extensibility; application for pain management. Intact skin with use of ample towel layering. Tolerated well.  6 6 Manual Therapy (97140) STM/ IASTM ro right volar hand;  IF base at A1 pulley.Kinesiotape applied using I strip with 20% tension to Right IF, to  inhibit muscle/tendon and decrease pain. Instructed to wear x 3-5 days, but to remove it if any  irritation, itching or pain presents.Sensory ballHEP education  22  22 N/A     Ultrasound (53664) Ultrasound at 0.8 w/cm continuous for 8 minutes to volar Right hand prior to manual treatment to improve circulation and tissue extensibility. 8  8   Total Treatment Time: 36 AssessmentPatient tolerated all therapeutic intervention well. Applied Kinesiotapa to Right IF for pain/edema management & will re-assess benefit of this modality at next therapy appointment. Continue with POC in order to maximize RUE functional use.    PlanFrequency:  2x per weekPlan for Next VisitROM, functional activities, dexterity, upgrade hep. Address patient's questions and concernsMaureen Gillermina Hu, MA,OTR/L, CHT

## 2021-06-05 ENCOUNTER — Inpatient Hospital Stay: Admit: 2021-06-05 | Discharge: 2021-06-05 | Payer: MEDICAID | Primary: Internal Medicine

## 2021-06-05 DIAGNOSIS — M79641 Pain in right hand: Secondary | ICD-10-CM

## 2021-06-05 DIAGNOSIS — M79642 Pain in left hand: Secondary | ICD-10-CM
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
## 2021-06-09 ENCOUNTER — Inpatient Hospital Stay: Admit: 2021-06-09 | Discharge: 2021-06-09 | Payer: MEDICAID | Primary: Internal Medicine

## 2021-06-09 DIAGNOSIS — G5601 Carpal tunnel syndrome, right upper limb: Secondary | ICD-10-CM

## 2021-06-09 DIAGNOSIS — M79641 Pain in right hand: Secondary | ICD-10-CM

## 2021-06-09 NOTE — Other
REHABILITATION SERVICES AT [facility] Services At [place] [phone]Ham[address] [place] [address]NU[place] Number: [phone] Number: [phone] Therapy Daily NotePatient Name:  Sloane Palmer Record Number:  [id number] Date of Birth:  1962-09-12Therapist:  Faylene Kurtz, OTR/LReferring Provider:  Genia Del Mangino, PAICD-10 Diagnosis(es):Problem List         ICD-10-CM   OT - R Hand Pain  Left hand pain M79.642   OT Right hand   Right hand pain M79.641   03/21/2018 [facility]     Major depressive disorder, recurrent episode, mild degree (HC CODE) (HC Code) F33.0  Acute posttraumatic stress disorder F43.11  General InformationTherapy Episode of Care  Date of Visit:   06/09/2021  Treatment Number:   9  Date the Treatment Plan was Initiated/Reviewed:  05/08/2021  Start of Care Date:   05/08/2021  Date of Surgery:   04/14/2021 (L thumb and IF trigger finger release and CTR))  Progress Report Due Date:   07/03/2021  MD Order Renewal Date:   12/1/2022Precautions/Limitations   Precautions/Limitations:  Standard precautionsNew Neurological Deficit   Did the patient have a new neurological deficit as evidenced by diminished muscle strength   of less than 3 at any time during the 90 days after spine surgery:  N/ACognition / Learning Assessment   Primary Learner Relationship:  Patient        Barriers to learning:  No barriers        Preferred language:  English        Preferred learning style:  Listening and DemonstrationI reviewed the Patient Care Agreement and Attendance Form with the Patient/Family.  The Patient/Family verbalized understanding.Medication Review:Current Outpatient Medications Medication Sig ? albuterol sulfate Inhale 2 puffs into the lungs every 6 (six) hours as needed for wheezing. ? atorvastatin Take 1 tablet (20 mg total) by mouth daily. (Patient not taking: Reported on 05/28/2021) ? blood sugar diagnostic TrueMetrix. Check glucose once daily for E11.9 ? Advair HFA Inhale 1 puff into the lungs 2 (two) times daily. After use, rinse mouth with water. ? fluticasone propionate Use 1 spray in each nostril daily. ? hydrOXYzine Take 1 tablet (10 mg total) by mouth daily as needed for anxiety. ? ibuprofen Take 1 tablet (600 mg total) by mouth every 6 (six) hours as needed. ? ketamine/doxepin/nifedipine/pentoxifylline: 10-5-8-8% topical (compound) Apply topically up to four times daily as needed to feet for symptoms of burning, electricity, and tingling. ? lancets TrueMetrix. Check glucose once daily for E11.9 ? lancing device with lancets Check blood sugar 1 time daily ? levocetirizine Take 1 tablet (5 mg total) by mouth every evening before dinner. For allergies ? pantoprazole Take 1 tablet (40 mg total) by mouth 2 (two) times daily before breakfast and dinner. 30 minutes prior to eating ? psyllium Take 1 packet by mouth daily. ? SITagliptin phosphate Take 1 tablet (100 mg total) by mouth daily. ? triamcinolone Apply topically 2 (two) times daily. SubjectivePatient stated that she is able to use her Right hand to brush teeth & eat; however, is unable to hold a cup.ObjectiveTender with palpation along volar incision ( IF A1 pulley )AROM:Right IFMP: 0/90PIP: 0/100DIP: 0/55TAM: 245Tip to DPC: ( after ROM/STM)IF: 1 cmMF: 2 cmRF: 0 cmSF: 0 cmTreatment Provided This VisitCPT Code Interventions Timed Minutes Untimed Minutes Total Minutes N/A     Manual Therapy (97140) STM/ IASTM in palm, and IF base at A1 pulley. Yellow foam : placed in-between digits to work on full functional fist closure>>HEP educationKinesiotape applied using I strip with 20% tension to Right  IF to inhibit muscle/tendon and decrease pain. Instructed to wear x 3-5 days, but to remove it if any irritation, itching or pain presents. 22  22 N/A     Ultrasound (09811) Ultrasound at 0.8 w/cm pulsed   prior to manual treatment to improve circulation and tissue extensibility.Tolerated well. 8  8   Total Treatment Time: 30 AssessmentPatient exhibited increased soft tissue mobility after today's session. Due to pt report that k-tape assisted with symptom management, re-applied today. Patient is Independent with carryover of home programs. Continue with POC in order to maximize Right hand functional use.    PlanFrequency:  2x per weekPlan for Next VisitROM, functional activities, dexterity, upgrade hep. Address patient's questions and concernsMaureen Gillermina Hu, MA, OTR/L, CHT

## 2021-06-12 ENCOUNTER — Inpatient Hospital Stay: Admit: 2021-06-12 | Discharge: 2021-06-12 | Payer: MEDICAID | Primary: Internal Medicine

## 2021-06-12 DIAGNOSIS — M79642 Pain in left hand: Secondary | ICD-10-CM

## 2021-06-12 NOTE — Other
REHABILITATION SERVICES AT [facility] Services At [place] [phone] [address]Ham[address] [place] [address]WJ[place]J Number: [phone] Number: [phone] Therapy Daily NotePatient Name:  Lynn Reed Record Number:  [id number] Date of Birth:  [birth year]Therapist:  Philmore Pali, OTR/L, CLTReferring Provider:  Genia Del Mangino, PAICD-10 Diagnosis(es):Problem List         ICD-10-CM   OT - R Hand Pain  Left hand pain M79.642   OT Right hand   Right hand pain M79.641   03/21/2018 [facility]     Major depressive disorder, recurrent episode, mild degree (HC CODE) (HC Code) F33.0  Acute posttraumatic stress disorder F43.11  General InformationTherapy Episode of Care  Date of Visit:   06/12/2021  Treatment Number:   10  Date the Treatment Plan was Initiated/Reviewed:  05/08/2021  Start of Care Date:   05/08/2021  Date of Surgery:   04/14/2021 (L thumb and IF trigger finger release and CTR))  Progress Report Due Date:   07/03/2021  MD Order Renewal Date:   12/1/2022Precautions/Limitations   Precautions/Limitations:  Standard precautionsNew Neurological Deficit   Did the patient have a new neurological deficit as evidenced by diminished muscle strength   of less than 3 at any time during the 90 days after spine surgery:  N/ACognition / Learning Assessment   Primary Learner Relationship:  Patient        Barriers to learning:  No barriers        Preferred language:  English        Preferred learning style:  Listening and DemonstrationI reviewed the Patient Care Agreement and Attendance Form with the Patient/Family.  The Patient/Family verbalized understanding.Medication Review:Current Outpatient Medications Medication Sig ? albuterol sulfate Inhale 2 puffs into the lungs every 6 (six) hours as needed for wheezing. ? atorvastatin Take 1 tablet (20 mg total) by mouth daily. ? blood sugar diagnostic TrueMetrix. Check glucose once daily for E11.9 ? Advair HFA Inhale 1 puff into the lungs 2 (two) times daily. After use, rinse mouth with water. ? fluticasone propionate Use 1 spray in each nostril daily. ? hydrOXYzine Take 1 tablet (10 mg total) by mouth daily as needed for anxiety. ? ibuprofen Take 1 tablet (600 mg total) by mouth every 6 (six) hours as needed. ? ketamine/doxepin/nifedipine/pentoxifylline: 10-5-8-8% topical (compound) Apply topically up to four times daily as needed to feet for symptoms of burning, electricity, and tingling. ? lancets TrueMetrix. Check glucose once daily for E11.9 ? lancing device with lancets Check blood sugar 1 time daily ? levocetirizine Take 1 tablet (5 mg total) by mouth every evening before dinner. For allergies ? pantoprazole Take 1 tablet (40 mg total) by mouth 2 (two) times daily before breakfast and dinner. 30 minutes prior to eating ? psyllium Take 1 packet by mouth daily. ? SITagliptin phosphate Take 1 tablet (100 mg total) by mouth daily. ? triamcinolone Apply topically 2 (two) times daily. SubjectiveIt (hand) sometimes wake me up in the night or if I try to do housework (ex: vacuum, etc.). Like a 4/10 pain.ObjectiveVolar A1 pulley at IF is more soft, less adherent than previous sessions. Wrist (CTR) incision has some thickness as well. Treatment Provided This VisitCPT Code Interventions Timed Minutes Untimed Minutes Total Minutes N/A     Manual Therapy (97140) STM/ and Cold Laser/LLLT to wrist,  palm, and IF base at A1 pulley. Cross frictional scar mobs techniques for MFR and softening of adherent scars.Kinesiotape applied using I strip with 20% tension to Right IF to inhibit muscle/tendon and decrease pain. Instructed to wear  x 3-5 days, but to remove it if any irritation, itching or pain presents. 25  25 N/A     Ultrasound (16109)       Total Treatment Time: 25 AssessmentPatient exhibited increased soft tissue mobility after today's session. Due to pt report that k-tape assisted with symptom management, re-applied today. Patient is Independent with carryover of home programs. Continue with POC in order to maximize Right hand functional use.    PlanFrequency:  2x per weekPlan for Next VisitROM, functional activities, dexterity, upgrade hep. Address patient's questions and concernsElectronically signed by Philmore Pali, OTR/L, CLT 11/10/2022[facility]

## 2021-06-16 ENCOUNTER — Inpatient Hospital Stay: Admit: 2021-06-16 | Discharge: 2021-06-16 | Payer: MEDICAID | Primary: Internal Medicine

## 2021-06-16 DIAGNOSIS — G5601 Carpal tunnel syndrome, right upper limb: Secondary | ICD-10-CM

## 2021-06-16 DIAGNOSIS — Z9889 Other specified postprocedural states: Secondary | ICD-10-CM

## 2021-06-16 DIAGNOSIS — M79642 Pain in left hand: Secondary | ICD-10-CM

## 2021-06-16 NOTE — Other
REHABILITATION SERVICES AT [facility] Services At [place] [phone] [address]Ham[address] [place] [address]WJ[place]BJ Number: [phone] Number: [phone] Therapy Daily NotePatient Name:  Lynn Reed Record Number:  [id number] Date of Birth:  [birth year]Therapist:  Hamilton Capri, OTRReferring Provider:  Genia Del Mangino, PAICD-10 Diagnosis(es):Problem List         ICD-10-CM   OT - R Hand Pain  Left hand pain M79.642   OT Right hand   Right hand pain M79.641   03/21/2018 [facility]     Major depressive disorder, recurrent episode, mild degree (HC CODE) (HC Code) F33.0  Acute posttraumatic stress disorder F43.11  General InformationTherapy Episode of Care  Date of Visit:   06/12/2021  Treatment Number:   10  Date the Treatment Plan was Initiated/Reviewed:  05/08/2021  Start of Care Date:   05/08/2021  Date of Surgery:   04/14/2021 (L thumb and IF trigger finger release and CTR))  Progress Report Due Date:   07/03/2021  MD Order Renewal Date:   12/1/2022Precautions/Limitations   Precautions/Limitations:  Standard precautionsNew Neurological Deficit   Did the patient have a new neurological deficit as evidenced by diminished muscle strength   of less than 3 at any time during the 90 days after spine surgery:  N/ACognition / Learning Assessment   Primary Learner Relationship:  Patient        Barriers to learning:  No barriers        Preferred language:  English        Preferred learning style:  Listening and DemonstrationI reviewed the Patient Care Agreement and Attendance Form with the Patient/Family.  The Patient/Family verbalized understanding.-Medication Review:Current Outpatient Medications Medication Sig ? albuterol sulfate Inhale 2 puffs into the lungs every 6 (six) hours as needed for wheezing. ? atorvastatin Take 1 tablet (20 mg total) by mouth daily. ? blood sugar diagnostic TrueMetrix. Check glucose once daily for E11.9 ? Advair HFA Inhale 1 puff into the lungs 2 (two) times daily. After use, rinse mouth with water. ? fluticasone propionate Use 1 spray in each nostril daily. ? hydrOXYzine Take 1 tablet (10 mg total) by mouth daily as needed for anxiety. ? ibuprofen Take 1 tablet (600 mg total) by mouth every 6 (six) hours as needed. ? ketamine/doxepin/nifedipine/pentoxifylline: 10-5-8-8% topical (compound) Apply topically up to four times daily as needed to feet for symptoms of burning, electricity, and tingling. ? lancets TrueMetrix. Check glucose once daily for E11.9 ? lancing device with lancets Check blood sugar 1 time daily ? levocetirizine Take 1 tablet (5 mg total) by mouth every evening before dinner. For allergies ? pantoprazole Take 1 tablet (40 mg total) by mouth 2 (two) times daily before breakfast and dinner. 30 minutes prior to eating ? psyllium Take 1 packet by mouth daily. ? SITagliptin phosphate Take 1 tablet (100 mg total) by mouth daily. ? triamcinolone Apply topically 2 (two) times daily. SubjectiveIt (hand) sometimes wake me up in the night or if I try to do housework (ex: vacuum, etc.). Like a 4/10 pain Patient reports that she has had increased tingling (along RSN to mid dorsal forearm for the past week.Objective  Grip: right 20# left 62#Treatment Provided This VisitCPT Code Interventions Timed Minutes Untimed Minutes Total Minutes N/A     Manual Therapy (97140) STM to wrist,  palm, and IF base at A1 pulley. Cross frictional scar mobs techniques for MFR and softening of adherent scars.Kinesiotape applied using I strip with 20% tension to proximal RSN with immediate resolution of burning along RSN 8  8  Therapeutic Exercise (97110) Extract 6 pegs from soft putty- right hand onlyFlatten/ roll and oppose x 5 to each fingerEDC x5Wrist stick- mod tension x12 20  20 N/A Total Treatment Time: 28 AssessmentPatient presents with significant weakness in R UE.  She tolerated light strengthening wellPlanFrequency:  2x per weekPlan for Next VisitROM, functional activities, dexterity, progress strengtheing upgrade hep. Address patient's questions and concernsNo medication changes.Electronically signed by Monia Sabal. Casimiro Needle, OTD, OTR/L, CHT 06/16/2021

## 2021-06-19 ENCOUNTER — Inpatient Hospital Stay: Admit: 2021-06-19 | Discharge: 2021-06-19 | Payer: MEDICAID | Primary: Internal Medicine

## 2021-06-19 DIAGNOSIS — Z9889 Other specified postprocedural states: Secondary | ICD-10-CM

## 2021-06-19 DIAGNOSIS — M79642 Pain in left hand: Secondary | ICD-10-CM

## 2021-06-19 NOTE — Other
REHABILITATION SERVICES AT [facility] Services At [place] [phone] [place] [address]WJXBJ Number: [phone] Number: [phone] Therapy Daily NotePatient Name:  Lynn Reed Record Number:  [id number] Date of Birth:  Jun 20, 1962Therapist:  Philmore Pali, OTR/L, CLTReferring Provider:  Genia Del Mangino, PAICD-10 Diagnosis(es):Problem List         ICD-10-CM   OT - R Hand Pain  Left hand pain M79.642   OT Right hand   Right hand pain M79.641   03/21/2018 [facility]     Major depressive disorder, recurrent episode, mild degree (HC CODE) (HC Code) F33.0  Acute posttraumatic stress disorder F43.11  General InformationTherapy Episode of Care  Date of Visit:   06/19/2021  Treatment Number:   11  Date the Treatment Plan was Initiated/Reviewed:  05/08/2021  Start of Care Date:   05/08/2021  Date of Surgery:   04/14/2021 (R thumb and IF trigger finger release and CTR))  Progress Report Due Date:   07/03/2021  MD Order Renewal Date:   12/1/2022Precautions/Limitations   Precautions/Limitations:  Standard precautionsNew Neurological Deficit   Did the patient have a new neurological deficit as evidenced by diminished muscle strength   of less than 3 at any time during the 90 days after spine surgery:  N/ACognition / Learning Assessment   Primary Learner Relationship:  Patient        Barriers to learning:  No barriers        Preferred language:  English        Preferred learning style:  Listening and DemonstrationI reviewed the Patient Care Agreement and Attendance Form with the Patient/Family.  The Patient/Family verbalized understanding.-Medication Review:Current Outpatient Medications Medication Sig ? albuterol sulfate Inhale 2 puffs into the lungs every 6 (six) hours as needed for wheezing. ? atorvastatin Take 1 tablet (20 mg total) by mouth daily. ? blood sugar diagnostic TrueMetrix. Check glucose once daily for E11.9 ? Advair HFA Inhale 1 puff into the lungs 2 (two) times daily. After use, rinse mouth with water. ? fluticasone propionate Use 1 spray in each nostril daily. ? hydrOXYzine Take 1 tablet (10 mg total) by mouth daily as needed for anxiety. ? ibuprofen Take 1 tablet (600 mg total) by mouth every 6 (six) hours as needed. ? ketamine/doxepin/nifedipine/pentoxifylline: 10-5-8-8% topical (compound) Apply topically up to four times daily as needed to feet for symptoms of burning, electricity, and tingling. ? lancets TrueMetrix. Check glucose once daily for E11.9 ? lancing device with lancets Check blood sugar 1 time daily ? levocetirizine Take 1 tablet (5 mg total) by mouth every evening before dinner. For allergies ? pantoprazole Take 1 tablet (40 mg total) by mouth 2 (two) times daily before breakfast and dinner. 30 minutes prior to eating ? psyllium Take 1 packet by mouth daily. ? SITagliptin phosphate Take 1 tablet (100 mg total) by mouth daily. ? triamcinolone Apply topically 2 (two) times daily. SubjectiveI can't open a container with this handObjective  Grip: right 20# left 62#Treatment Provided This VisitCPT Code Interventions Timed Minutes Untimed Minutes Total Minutes N/A     Manual Therapy (97140) STM to wrist,  palm, and IF base at A1 pulley. Cross frictional scar mobs techniques for MFR and softening of adherent scars.Kinesiotape applied using I strip with 20% tension to proximal RSN with immediate resolution of burning along RSN 10  10 Therapeutic Exercise (97110) Extract 6 small discs from soft putty- right hand onlyFlatten/ roll and oppose x 5 to each finger, grasp for composite fistEDC x5Power web (R) digital extension (3-4/10 pain  at A1 pulley)Wrist stick- mod tension x12On ramp, 1# dart throwers (2 sets of 10x) 20  20 N/A       Total Treatment Time: 30 AssessmentPatient presents with significant weakness in R UE. SHe reports pain with composite grip with beige putty, power web digital extension and no issue with two point pinch.PlanFrequency:  2x per weekPlan for Next VisitROM, functional activities, dexterity, progress strengtheing upgrade hep. Address patient's questions and concernsNo medication changes.Electronically signed by Philmore Pali, OTR/L, CLT 11/17/2022[facility]

## 2021-06-30 ENCOUNTER — Inpatient Hospital Stay: Admit: 2021-06-30 | Discharge: 2021-06-30 | Payer: MEDICAID | Primary: Internal Medicine

## 2021-06-30 DIAGNOSIS — R002 Palpitations: Secondary | ICD-10-CM

## 2021-07-02 DIAGNOSIS — G5601 Carpal tunnel syndrome, right upper limb: Secondary | ICD-10-CM

## 2021-07-02 DIAGNOSIS — Z9889 Other specified postprocedural states: Secondary | ICD-10-CM

## 2021-07-02 DIAGNOSIS — M79641 Pain in right hand: Secondary | ICD-10-CM

## 2021-07-03 ENCOUNTER — Inpatient Hospital Stay: Admit: 2021-07-03 | Discharge: 2021-07-03 | Payer: MEDICAID | Primary: Internal Medicine

## 2021-07-03 DIAGNOSIS — Z9889 Other specified postprocedural states: Secondary | ICD-10-CM

## 2021-07-03 DIAGNOSIS — M65312 Trigger thumb, left thumb: Secondary | ICD-10-CM
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
## 2021-07-08 ENCOUNTER — Inpatient Hospital Stay: Admit: 2021-07-08 | Discharge: 2021-07-08 | Payer: MEDICAID | Primary: Internal Medicine

## 2021-07-08 DIAGNOSIS — M79642 Pain in left hand: Secondary | ICD-10-CM

## 2021-07-08 DIAGNOSIS — M65312 Trigger thumb, left thumb: Secondary | ICD-10-CM

## 2021-07-08 DIAGNOSIS — M65342 Trigger finger, left ring finger: Secondary | ICD-10-CM
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
## 2021-07-10 ENCOUNTER — Inpatient Hospital Stay: Admit: 2021-07-10 | Discharge: 2021-07-10 | Payer: MEDICAID | Primary: Internal Medicine

## 2021-07-10 DIAGNOSIS — M79642 Pain in left hand: Secondary | ICD-10-CM

## 2021-07-10 DIAGNOSIS — Z9889 Other specified postprocedural states: Secondary | ICD-10-CM

## 2021-07-10 DIAGNOSIS — M79641 Pain in right hand: Secondary | ICD-10-CM

## 2021-07-10 DIAGNOSIS — M65312 Trigger thumb, left thumb: Secondary | ICD-10-CM

## 2021-07-10 DIAGNOSIS — M65342 Trigger finger, left ring finger: Secondary | ICD-10-CM

## 2021-07-10 NOTE — Other
REHABILITATION SERVICES AT [facility] Services At [place] [phone] [address] [place] [address]WUXLK Number: [phone] Number: [phone] Therapy Cancel Note12/8/2022Patient Name:  Lynn Reed Record Number:  [id number] Date of Birth:  1962-10-25Therapist:  Nera, [place] cancelled today's appointment. Pt is aware of attendance policy.Electronically signed by Philmore Pali, OTR/L, CLT 12/8/2022[facility]

## 2021-07-14 ENCOUNTER — Inpatient Hospital Stay: Admit: 2021-07-14 | Discharge: 2021-07-14 | Payer: MEDICAID | Primary: Internal Medicine

## 2021-07-14 DIAGNOSIS — Z9889 Other specified postprocedural states: Secondary | ICD-10-CM
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
## 2021-07-16 ENCOUNTER — Inpatient Hospital Stay: Admit: 2021-07-16 | Discharge: 2021-07-16 | Payer: MEDICAID | Primary: Internal Medicine

## 2021-07-16 DIAGNOSIS — M79642 Pain in left hand: Secondary | ICD-10-CM
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
## 2021-07-21 ENCOUNTER — Inpatient Hospital Stay: Admit: 2021-07-21 | Discharge: 2021-07-21 | Payer: MEDICAID | Primary: Internal Medicine

## 2021-07-21 DIAGNOSIS — Z9889 Other specified postprocedural states: Secondary | ICD-10-CM

## 2021-07-21 DIAGNOSIS — M79641 Pain in right hand: Secondary | ICD-10-CM

## 2021-07-21 DIAGNOSIS — M79642 Pain in left hand: Secondary | ICD-10-CM

## 2021-07-21 DIAGNOSIS — M65342 Trigger finger, left ring finger: Secondary | ICD-10-CM
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
## 2021-07-23 ENCOUNTER — Inpatient Hospital Stay: Admit: 2021-07-23 | Discharge: 2021-07-23 | Payer: MEDICAID | Primary: Internal Medicine

## 2021-07-23 DIAGNOSIS — Z9889 Other specified postprocedural states: Secondary | ICD-10-CM

## 2021-07-23 DIAGNOSIS — M65342 Trigger finger, left ring finger: Secondary | ICD-10-CM

## 2021-07-23 DIAGNOSIS — M79641 Pain in right hand: Secondary | ICD-10-CM

## 2021-07-29 ENCOUNTER — Ambulatory Visit: Admit: 2021-07-29 | Payer: MEDICAID | Primary: Internal Medicine

## 2021-07-29 DIAGNOSIS — M79642 Pain in left hand: Secondary | ICD-10-CM

## 2021-07-29 DIAGNOSIS — Z9889 Other specified postprocedural states: Secondary | ICD-10-CM
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
## 2021-07-30 ENCOUNTER — Encounter: Admit: 2021-07-30 | Payer: MEDICAID | Attending: Plastic Surgery | Primary: Internal Medicine

## 2021-07-31 DIAGNOSIS — M79642 Pain in left hand: Secondary | ICD-10-CM

## 2021-07-31 DIAGNOSIS — Z9889 Other specified postprocedural states: Secondary | ICD-10-CM

## 2021-07-31 DIAGNOSIS — M65342 Trigger finger, left ring finger: Secondary | ICD-10-CM

## 2021-07-31 DIAGNOSIS — M65312 Trigger thumb, left thumb: Secondary | ICD-10-CM

## 2021-08-02 DIAGNOSIS — M79641 Pain in right hand: Secondary | ICD-10-CM

## 2021-08-02 DIAGNOSIS — M79642 Pain in left hand: Secondary | ICD-10-CM

## 2021-08-02 DIAGNOSIS — M65342 Trigger finger, left ring finger: Secondary | ICD-10-CM

## 2021-08-02 DIAGNOSIS — M65312 Trigger thumb, left thumb: Secondary | ICD-10-CM

## 2021-08-02 DIAGNOSIS — Z9889 Other specified postprocedural states: Secondary | ICD-10-CM

## 2021-08-06 ENCOUNTER — Inpatient Hospital Stay: Admit: 2021-08-06 | Discharge: 2021-08-06 | Payer: PRIVATE HEALTH INSURANCE | Primary: Internal Medicine

## 2021-08-19 ENCOUNTER — Encounter: Admit: 2021-08-19 | Payer: PRIVATE HEALTH INSURANCE | Attending: Plastic Surgery | Primary: Internal Medicine

## 2021-08-19 ENCOUNTER — Ambulatory Visit: Admit: 2021-08-19 | Payer: MEDICAID | Attending: Plastic Surgery | Primary: Internal Medicine

## 2021-08-19 DIAGNOSIS — IMO0002 Cystocele: Secondary | ICD-10-CM

## 2021-08-19 DIAGNOSIS — F4310 Post-traumatic stress disorder, unspecified: Secondary | ICD-10-CM

## 2021-08-19 DIAGNOSIS — U071 COVID-19 virus infection: Secondary | ICD-10-CM

## 2021-08-19 DIAGNOSIS — K219 Gastro-esophageal reflux disease without esophagitis: Secondary | ICD-10-CM

## 2021-08-19 DIAGNOSIS — J309 Allergic rhinitis, unspecified: Secondary | ICD-10-CM

## 2021-08-19 DIAGNOSIS — R112 Nausea with vomiting, unspecified: Secondary | ICD-10-CM

## 2021-08-19 DIAGNOSIS — F419 Anxiety disorder, unspecified: Secondary | ICD-10-CM

## 2021-08-19 DIAGNOSIS — Z9889 Other specified postprocedural states: Secondary | ICD-10-CM

## 2021-08-19 DIAGNOSIS — M4180 Other forms of scoliosis, site unspecified: Secondary | ICD-10-CM

## 2021-08-19 DIAGNOSIS — D219 Benign neoplasm of connective and other soft tissue, unspecified: Secondary | ICD-10-CM

## 2021-08-19 DIAGNOSIS — K635 Polyp of colon: Secondary | ICD-10-CM

## 2021-08-19 DIAGNOSIS — J45909 Unspecified asthma, uncomplicated: Secondary | ICD-10-CM

## 2021-08-19 DIAGNOSIS — M1990 Unspecified osteoarthritis, unspecified site: Secondary | ICD-10-CM

## 2021-08-19 DIAGNOSIS — X008XXA Other exposure to uncontrolled fire in building or structure, initial encounter: Secondary | ICD-10-CM

## 2021-08-19 DIAGNOSIS — E119 Type 2 diabetes mellitus without complications: Secondary | ICD-10-CM

## 2021-08-20 ENCOUNTER — Encounter: Admit: 2021-08-20 | Payer: PRIVATE HEALTH INSURANCE | Attending: Plastic Surgery | Primary: Internal Medicine

## 2021-08-20 DIAGNOSIS — J45909 Unspecified asthma, uncomplicated: Secondary | ICD-10-CM

## 2021-08-20 DIAGNOSIS — F419 Anxiety disorder, unspecified: Secondary | ICD-10-CM

## 2021-08-20 DIAGNOSIS — J309 Allergic rhinitis, unspecified: Secondary | ICD-10-CM

## 2021-08-20 DIAGNOSIS — R112 Nausea with vomiting, unspecified: Secondary | ICD-10-CM

## 2021-08-20 DIAGNOSIS — D219 Benign neoplasm of connective and other soft tissue, unspecified: Secondary | ICD-10-CM

## 2021-08-20 DIAGNOSIS — K219 Gastro-esophageal reflux disease without esophagitis: Secondary | ICD-10-CM

## 2021-08-20 DIAGNOSIS — X008XXA Other exposure to uncontrolled fire in building or structure, initial encounter: Secondary | ICD-10-CM

## 2021-08-20 DIAGNOSIS — M4180 Other forms of scoliosis, site unspecified: Secondary | ICD-10-CM

## 2021-08-20 DIAGNOSIS — K635 Polyp of colon: Secondary | ICD-10-CM

## 2021-08-20 DIAGNOSIS — M1990 Unspecified osteoarthritis, unspecified site: Secondary | ICD-10-CM

## 2021-08-20 DIAGNOSIS — IMO0002 Cystocele: Secondary | ICD-10-CM

## 2021-08-20 DIAGNOSIS — F4310 Post-traumatic stress disorder, unspecified: Secondary | ICD-10-CM

## 2021-08-20 DIAGNOSIS — E119 Type 2 diabetes mellitus without complications: Secondary | ICD-10-CM

## 2021-08-20 DIAGNOSIS — U071 COVID-19 virus infection: Secondary | ICD-10-CM

## 2021-08-20 NOTE — Progress Notes
Plastic Surgery ClinicJanuary 17, 2023					Patient name: Lynn Reed MRN:      [id number] Date of birth:   [birth year]Date of visit:    1/17/2023Provider:	Taaj Hurlbut M Floride Hutmacher, PAPOST OPERATIVE VISITDate of Surgery:  9/12/22Surgeon: Nathen May Procedure: Right endoscopic open carpal tunnel release, thumb trigger release, index finger trigger releaseMs. Lynn Reed is now 4 months s/p above procedure.Since her surgery she has issues with range-of-motion.She is improving, patient is attending therapy weeklyReview of Systems:Denies fever, chills, CP, SOB, cough, sore throat, peripheral edema, nausea, vomiting, rash, and tingling. Physical Exam:Alert, oriented x 3 Well developed, well nourished, NADHENT:  Normal cephalic, atraumaticNeck:  Supple, normal ROMCardiac:  Normal rate, no peripheral edemaPulmonary:  Normal breathing effort, no accessory muscle useSkin: Warm and dryPsychiatric: Normal mood and affectExam:   The incisions appear infection free and healing wellNormal range of motion, able to make a full fistSensation intact to light touchImaging:  None todayTherapy:  Patient will continue range-of-motion exercisesPlan and Follow-up: [name] is slowly recovering from surgery.  Range of motion is improving.Follow-up as neededPatient will contact the office if any problems present in the interim.Lynn Reed M Mangino1/17/2023 10:45 AM

## 2021-09-04 ENCOUNTER — Inpatient Hospital Stay: Admit: 2021-09-04 | Discharge: 2021-09-04 | Payer: PRIVATE HEALTH INSURANCE | Primary: Internal Medicine

## 2021-10-06 ENCOUNTER — Inpatient Hospital Stay: Admit: 2021-10-06 | Discharge: 2021-10-06 | Payer: PRIVATE HEALTH INSURANCE | Primary: Internal Medicine

## 2021-10-30 ENCOUNTER — Inpatient Hospital Stay: Admit: 2021-10-30 | Discharge: 2021-10-30 | Payer: PRIVATE HEALTH INSURANCE | Primary: Internal Medicine

## 2021-11-24 ENCOUNTER — Inpatient Hospital Stay: Admit: 2021-11-24 | Discharge: 2021-11-24 | Payer: PRIVATE HEALTH INSURANCE | Primary: Internal Medicine

## 2021-12-05 ENCOUNTER — Encounter: Admit: 2021-12-05 | Payer: PRIVATE HEALTH INSURANCE | Primary: Internal Medicine

## 2021-12-22 ENCOUNTER — Inpatient Hospital Stay: Admit: 2021-12-22 | Discharge: 2021-12-22 | Payer: PRIVATE HEALTH INSURANCE | Primary: Internal Medicine

## 2022-01-07 ENCOUNTER — Inpatient Hospital Stay: Admit: 2022-01-07 | Discharge: 2022-01-07 | Payer: MEDICAID | Primary: Internal Medicine

## 2022-01-07 DIAGNOSIS — M542 Cervicalgia: Secondary | ICD-10-CM

## 2022-01-07 DIAGNOSIS — G8929 Other chronic pain: Secondary | ICD-10-CM
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
## 2022-01-14 ENCOUNTER — Inpatient Hospital Stay: Admit: 2022-01-14 | Discharge: 2022-01-14 | Payer: MEDICAID | Primary: Internal Medicine

## 2022-01-14 DIAGNOSIS — M542 Cervicalgia: Secondary | ICD-10-CM

## 2022-01-14 DIAGNOSIS — G8929 Other chronic pain: Secondary | ICD-10-CM

## 2022-01-14 NOTE — Other
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 albuterol sulfate Inhale 2 puffs into the lungs every 6 (six) hours as needed for wheezing. ? atorvastatin Take 1 tablet (20 mg total) by mouth daily. ? blood sugar diagnostic TrueMetrix. Check glucose once daily for E11.9 ? carbamide peroxide Place 5 drops into both ears 2 (two) times daily for 10 days. ? dexlansoprazole Take 1 capsule (60 mg total) by mouth daily. ? empagliflozin Take 1 tablet (25 mg total) by mouth daily. ? erythromycin Place into the left eye every 6 (six) hours for 7 days. Apply 1 cm ribbon to inside lower eyelid or affected area. ? fluticasone propion-salmeteroL Inhale 1 puff into the lungs 2 (two) times daily. After use, rinse mouth with water. ? hydrOXYzine Take 1 tablet (10 mg total) by mouth daily as needed for anxiety. ? ketamine/doxepin/nifedipine/pentoxifylline: 10-5-8-8% topical (compound) Apply topically up to four times daily as needed to feet for symptoms of burning, electricity, and tingling. ? lancets TrueMetrix. Check glucose once daily for E11.9 ? lancing device with lancets Check blood sugar 1 time daily ? levocetirizine Take 1 tablet (5 mg total) by mouth every evening before dinner. For allergies ? methylPREDNISolone follow package directions ? mometasone Use 2 sprays in each nostril daily. ? naproxen Take 1 tablet (500 mg total) by mouth 2 (two) times daily with breakfast and dinner. (Patient not taking: Reported on 01/07/2022) ? olopatadine Place 1 drop into both eyes 2 (two) times daily. ? psyllium Take 1 packet by mouth daily. ? SITagliptin phosphate Take 1 tablet (100 mg total) by mouth daily. (Patient not taking: Reported on 01/07/2022) ? triamcinolone Apply topically 2 (two) times daily. SubjectiveI've been doing the exercises. The doorway hurts.ObjectiveOutcomes Measure not required for this visitTreatment Provided This VisitCPT Code Interventions Timed Minutes Untimed Minutes Total Minutes Manual Therapy (97140) Mckenzie retraction off EOB 3x10, then with extension x10Reverse NAGS T1-5 15   Therapeutic Exercise (97110) Foam roll stretches:T, 90/90, Y stretch x1 min eachSnow angels x101# bar flexion x10Tennis ball self SOR - not todayChin tucks x5, reviewed formscap squeezes x5Backward shoulder rolls x5Doorway pec stretch 3x30 holds 15   N/A SOR, STM suboccipitalsSnag C2 with neck extension x10    N/A       Total Treatment Time: 30 AssessmentPt is very stiff in her upper thoracic spine and tight in her pectorals and upper traps. Needs mobility as well as strength in these areas.PlanFrequency:  2x per weekPlan for Next VisitProgression to upper body strengthening, starting with midback including rowing, shoulder extensionPatient holds a lot of tension in her shoulders and knees cuing to relax upper traps throughout several activities today

## 2022-01-21 ENCOUNTER — Inpatient Hospital Stay: Admit: 2022-01-21 | Discharge: 2022-01-21 | Payer: MEDICAID | Primary: Internal Medicine

## 2022-01-21 DIAGNOSIS — M542 Cervicalgia: Secondary | ICD-10-CM

## 2022-01-21 DIAGNOSIS — G8929 Other chronic pain: Secondary | ICD-10-CM

## 2022-01-21 NOTE — Other
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 albuterol sulfate Inhale 2 puffs into the lungs every 6 (six) hours as needed for wheezing. ? atorvastatin Take 1 tablet (20 mg total) by mouth daily. ? blood sugar diagnostic TrueMetrix. Check glucose once daily for E11.9 ? carbamide peroxide Place 5 drops into both ears 2 (two) times daily for 10 days. ? dexlansoprazole Take 1 capsule (60 mg total) by mouth daily. ? empagliflozin Take 1 tablet (25 mg total) by mouth daily. ? fluticasone propion-salmeteroL Inhale 1 puff into the lungs 2 (two) times daily. After use, rinse mouth with water. ? hydrOXYzine Take 1 tablet (10 mg total) by mouth daily as needed for anxiety. ? ketamine/doxepin/nifedipine/pentoxifylline: 10-5-8-8% topical (compound) Apply topically up to four times daily as needed to feet for symptoms of burning, electricity, and tingling. ? lancets TrueMetrix. Check glucose once daily for E11.9 ? lancing device with lancets Check blood sugar 1 time daily ? levocetirizine Take 1 tablet (5 mg total) by mouth every evening before dinner. For allergies ? mometasone Use 2 sprays in each nostril daily. ? naproxen Take 1 tablet (500 mg total) by mouth 2 (two) times daily with breakfast and dinner. (Patient not taking: Reported on 01/07/2022) ? olopatadine Place 1 drop into both eyes 2 (two) times daily. ? psyllium Take 1 packet by mouth daily. ? SITagliptin phosphate Take 1 tablet (100 mg total) by mouth daily. (Patient not taking: Reported on 01/07/2022) ? triamcinolone Apply topically 2 (two) times daily. SubjectiveI am feeling better. I was a little sore in my back just that day but overall less pain. I still get a cracking sound in my neck that sometimes hurts on the right side.ObjectiveOutcomes Measure not required for this visit AROM of cspine full and pain free except mild restriction L SB about 25%, no pain reportedTreatment Provided This VisitCPT Code Interventions Timed Minutes Untimed Minutes Total Minutes Manual Therapy (97140) SOR 8   Therapeutic Exercise (97110) Foam roll stretches:T, 90/90 pec stretches x1 min eachSnow angels x10Sun bathers x101# bar flexion x10Row YTB 2x15Straight arm pulldown pink TB 2x15Chest press YTB 2x15Backward shoulder rolls 2# 2x15 30   N/A     N/A       Total Treatment Time: 38 AssessmentImprovements noted today in cervical AROM. Pt still tight in her suboccipitals and needs cueing throughout strengthening for proper form and to avoid excessive shrug.PlanFrequency:  2x per weekPlan for Next VisitProgression to upper body strengthening, starting with midback including rowing, shoulder extension. Add to HEP when ready.

## 2022-01-22 ENCOUNTER — Inpatient Hospital Stay: Admit: 2022-01-22 | Discharge: 2022-01-22 | Payer: PRIVATE HEALTH INSURANCE | Primary: Internal Medicine

## 2022-01-28 ENCOUNTER — Inpatient Hospital Stay: Admit: 2022-01-28 | Discharge: 2022-01-28 | Payer: MEDICAID | Primary: Internal Medicine

## 2022-01-28 DIAGNOSIS — M542 Cervicalgia: Secondary | ICD-10-CM

## 2022-01-28 DIAGNOSIS — G8929 Other chronic pain: Secondary | ICD-10-CM

## 2022-01-28 NOTE — Other
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 albuterol sulfate Inhale 2 puffs into the lungs every 6 (six) hours as needed for wheezing. ? atorvastatin Take 1 tablet (20 mg total) by mouth daily. ? blood sugar diagnostic TrueMetrix. Check glucose once daily for E11.9 ? dexlansoprazole Take 1 capsule (60 mg total) by mouth daily. ? empagliflozin Take 1 tablet (25 mg total) by mouth daily. ? fluticasone propion-salmeteroL Inhale 1 puff into the lungs 2 (two) times daily. After use, rinse mouth with water. ? hydrOXYzine Take 1 tablet (10 mg total) by mouth daily as needed for anxiety. ? ketamine/doxepin/nifedipine/pentoxifylline: 10-5-8-8% topical (compound) Apply topically up to four times daily as needed to feet for symptoms of burning, electricity, and tingling. ? lancets TrueMetrix. Check glucose once daily for E11.9 ? lancing device with lancets Check blood sugar 1 time daily ? levocetirizine Take 1 tablet (5 mg total) by mouth every evening before dinner. For allergies ? mometasone Use 2 sprays in each nostril daily. ? naproxen Take 1 tablet (500 mg total) by mouth 2 (two) times daily with breakfast and dinner. (Patient not taking: Reported on 01/07/2022) ? olopatadine Place 1 drop into both eyes 2 (two) times daily. ? psyllium Take 1 packet by mouth daily. ? SITagliptin phosphate Take 1 tablet (100 mg total) by mouth daily. (Patient not taking: Reported on 01/07/2022) ? triamcinolone Apply topically 2 (two) times daily. SubjectiveMy neck is feeling better. Yesterday it hurt with the weather but overall better.ObjectiveOutcomes Measure not required for this visit AROM of cspine full and pain free except mild restriction L SB about 25%, no pain reportedPt found in poor posture in waiting room, hips slouched forward in chairTreatment Provided This VisitCPT Code Interventions Timed Minutes Untimed Minutes Total Minutes N/A     Therapeutic Exercise (97110) Foam roll stretches:T, 90/90 pec stretches x1 min eachSnow angels x15Sun bathers x151# bar flexion x15Row YTB 2x15Straight arm pulldown pink TB 2x15Chest press YTB 2x15HEP issued with red band 30   N/A     N/A       Total Treatment Time: 30 Access Code: [email or web address]: [email or web address]: 06/28/2023Prepared by: Consuella Lose HORNExercises- Snow Angels on Foam Roll  - 1 x daily - 7 x weekly - 2 sets - 10 reps- Supine Chest Stretch on Foam Roll  - 1 x daily - 7 x weekly - 1 reps - 60 second hold- Open Book Chest Rotation Stretch on Foam 1/2 Roll  - 1 x daily - 7 x weekly - 60 second hold- Overhead Reach on Foam Roll  - 1 x daily - 7 x weekly - 2 sets - 10 reps- Standing Row with Anchored Resistance  - 1 x daily - 5 x weekly - 3 sets - 10 reps- Shoulder extension with  resistance - Neutral  - 1 x daily - 5 x weekly - 3 sets - 10 reps- Chest Press with Resistance  - 1 x daily - 5 x weekly - 3 sets - 10 repsAssessmentPt still needs some cues to avoid shrug but does better with banded exercises. Needs to continue work on posture.PlanFrequency:  2x per weekPlan for Next VisitProgression to upper body strengthening.

## 2022-02-04 ENCOUNTER — Inpatient Hospital Stay: Admit: 2022-02-04 | Discharge: 2022-02-04 | Payer: MEDICAID | Primary: Internal Medicine

## 2022-02-04 NOTE — Other
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 albuterol sulfate Inhale 2 puffs into the lungs every 6 (six) hours as needed for wheezing. ? atorvastatin Take 1 tablet (20 mg total) by mouth daily. ? blood sugar diagnostic TrueMetrix. Check glucose once daily for E11.9 ? dexlansoprazole Take 1 capsule (60 mg total) by mouth daily. ? empagliflozin Take 1 tablet (25 mg total) by mouth daily. ? fluticasone propion-salmeteroL Inhale 1 puff into the lungs 2 (two) times daily. After use, rinse mouth with water. ? hydrOXYzine Take 1 tablet (10 mg total) by mouth daily as needed for anxiety. ? ketamine/doxepin/nifedipine/pentoxifylline: 10-5-8-8% topical (compound) Apply topically up to four times daily as needed to feet for symptoms of burning, electricity, and tingling. ? lancets TrueMetrix. Check glucose once daily for E11.9 ? lancing device with lancets Check blood sugar 1 time daily ? levocetirizine Take 1 tablet (5 mg total) by mouth every evening before dinner. For allergies ? mometasone Use 2 sprays in each nostril daily. ? naproxen Take 1 tablet (500 mg total) by mouth 2 (two) times daily with breakfast and dinner. (Patient not taking: Reported on 01/07/2022) ? olopatadine Place 1 drop into both eyes 2 (two) times daily. ? psyllium Take 1 packet by mouth daily. ? SITagliptin phosphate Take 1 tablet (100 mg total) by mouth daily. (Patient not taking: Reported on 01/07/2022) ? triamcinolone Apply topically 2 (two) times daily. SubjectiveI had a couple of teeth removed so I didn't exercise as much. The neck is better overall. It still cracks sometimes and I don't know why. It just feels weird, it doesn't hurt. ObjectiveOutcomes Measure not required for this visit AROM of cspine full and pain free except mild restriction B SBPt continues to look as if shoulders are shruggedTreatment Provided This VisitCPT Code Interventions Timed Minutes Untimed Minutes Total Minutes N/A     Therapeutic Exercise (97110) Foam roll stretches:T, 90/90 pec stretches x1 min eachSnow angels x15Sun bathers x15Row YTB 2x15 with tactile cues for scap depressionStraight arm pulldown pink TB 2x15 with tactile cues for scap depression 20   Neuromuscular Re-Education (60630) Elbows on wall serratus protraction/retraction with tactile cueing throughout for scapular depression 2x15Prone shoulder extension with low trap set/scapular depression first 3x10 with tactile cues, reducing each setProne W with emphasis on lower trap set 2x15 with tactile cues, reducing as she went on 20   N/A       Total Treatment Time: 40 Access Code: [email or web address]: [email or web address]: 06/28/2023Prepared by: Consuella Lose HORNExercises- Starlyn Skeans on Foam Roll  - 1 x daily - 7 x weekly - 2 sets - 10 reps- Supine Chest Stretch on Foam Roll  - 1 x daily - 7 x weekly - 1 reps - 60 second hold- Open Book Chest Rotation Stretch on  Foam 1/2 Roll  - 1 x daily - 7 x weekly - 60 second hold- Overhead Reach on Foam Roll  - 1 x daily - 7 x weekly - 2 sets - 10 reps- Standing Row with Anchored Resistance  - 1 x daily - 5 x weekly - 3 sets - 10 reps- Shoulder extension with resistance - Neutral  - 1 x daily - 5 x weekly - 3 sets - 10 reps- Chest Press with Resistance  - 1 x daily - 5 x weekly - 3 sets - 10 repsAssessmentPatient had some difficulty on right greater than left engaging lower trapezius.  Attempted standing against wall reach low trapped depression patient unable to engage muscle in this position.  After repetitive exercises on table, patient demonstrated improved engagement and scapular mobility, which did translate into improved performance with her rowing exercises.PlanFrequency:  1x per weekPlan for Next VisitReassess with anticipated discharge at the next visit.

## 2022-02-16 ENCOUNTER — Inpatient Hospital Stay: Admit: 2022-02-16 | Discharge: 2022-02-16 | Payer: MEDICAID | Primary: Internal Medicine

## 2022-02-16 DIAGNOSIS — M542 Cervicalgia: Secondary | ICD-10-CM
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
## 2022-02-19 ENCOUNTER — Inpatient Hospital Stay: Admit: 2022-02-19 | Discharge: 2022-02-19 | Payer: PRIVATE HEALTH INSURANCE | Primary: Internal Medicine

## 2022-03-02 DIAGNOSIS — M542 Cervicalgia: Secondary | ICD-10-CM

## 2022-03-02 DIAGNOSIS — G8929 Other chronic pain: Secondary | ICD-10-CM

## 2022-03-26 ENCOUNTER — Inpatient Hospital Stay: Admit: 2022-03-26 | Discharge: 2022-03-26 | Payer: PRIVATE HEALTH INSURANCE | Primary: Internal Medicine

## 2022-04-23 ENCOUNTER — Inpatient Hospital Stay: Admit: 2022-04-23 | Discharge: 2022-04-23 | Payer: PRIVATE HEALTH INSURANCE | Primary: Internal Medicine

## 2022-05-21 ENCOUNTER — Inpatient Hospital Stay: Admit: 2022-05-21 | Discharge: 2022-05-21 | Payer: PRIVATE HEALTH INSURANCE | Primary: Internal Medicine

## 2022-06-23 ENCOUNTER — Inpatient Hospital Stay: Admit: 2022-06-23 | Discharge: 2022-06-23 | Payer: PRIVATE HEALTH INSURANCE | Primary: Internal Medicine

## 2022-07-07 ENCOUNTER — Inpatient Hospital Stay: Admit: 2022-07-07 | Discharge: 2022-07-07 | Payer: PRIVATE HEALTH INSURANCE | Primary: Internal Medicine

## 2022-08-27 ENCOUNTER — Inpatient Hospital Stay: Admit: 2022-08-27 | Discharge: 2022-08-27 | Payer: PRIVATE HEALTH INSURANCE | Primary: Internal Medicine

## 2022-09-16 ENCOUNTER — Inpatient Hospital Stay: Admit: 2022-09-16 | Discharge: 2022-09-16 | Payer: MEDICAID | Primary: Internal Medicine

## 2022-09-16 DIAGNOSIS — Z1231 Encounter for screening mammogram for malignant neoplasm of breast: Secondary | ICD-10-CM

## 2022-09-29 ENCOUNTER — Inpatient Hospital Stay: Admit: 2022-09-29 | Discharge: 2022-09-29 | Payer: PRIVATE HEALTH INSURANCE | Primary: Internal Medicine

## 2022-10-27 ENCOUNTER — Inpatient Hospital Stay: Admit: 2022-10-27 | Discharge: 2022-10-27 | Payer: PRIVATE HEALTH INSURANCE | Primary: Internal Medicine

## 2022-11-24 ENCOUNTER — Inpatient Hospital Stay: Admit: 2022-11-24 | Discharge: 2022-11-24 | Payer: PRIVATE HEALTH INSURANCE | Primary: Internal Medicine

## 2023-01-14 ENCOUNTER — Inpatient Hospital Stay: Admit: 2023-01-14 | Discharge: 2023-01-14 | Payer: PRIVATE HEALTH INSURANCE | Primary: Internal Medicine

## 2023-02-02 ENCOUNTER — Inpatient Hospital Stay: Admit: 2023-02-02 | Discharge: 2023-02-02 | Payer: PRIVATE HEALTH INSURANCE | Primary: Internal Medicine

## 2023-02-08 ENCOUNTER — Inpatient Hospital Stay: Admit: 2023-02-08 | Discharge: 2023-02-08 | Payer: PRIVATE HEALTH INSURANCE | Primary: Internal Medicine

## 2023-03-08 ENCOUNTER — Inpatient Hospital Stay: Admit: 2023-03-08 | Discharge: 2023-03-08 | Payer: PRIVATE HEALTH INSURANCE | Primary: Internal Medicine

## 2023-03-30 ENCOUNTER — Encounter: Admit: 2023-03-30 | Payer: PRIVATE HEALTH INSURANCE | Attending: Hepatology | Primary: Internal Medicine

## 2023-03-30 ENCOUNTER — Encounter: Admit: 2023-03-30 | Payer: PRIVATE HEALTH INSURANCE | Primary: Internal Medicine

## 2023-03-30 MED ORDER — PEG 3350-ELECTROLYTES 236 GRAM-22.74 GRAM-6.74 GRAM-5.86 GRAM SOLUTION
236-22.74-6.74-5.86 -5.86 gram | Freq: Once | ORAL | 1 refills | 1.00 days | Status: AC
Start: 2023-03-30 — End: ?

## 2023-04-19 ENCOUNTER — Inpatient Hospital Stay: Admit: 2023-04-19 | Discharge: 2023-04-19 | Payer: PRIVATE HEALTH INSURANCE | Primary: Internal Medicine

## 2023-05-06 ENCOUNTER — Encounter: Admit: 2023-05-06 | Payer: MEDICAID | Attending: Adult Health | Primary: Internal Medicine

## 2023-05-06 MED FILL — PEG 3350-ELECTROLYTES 236 GRAM-22.74 GRAM-6.74 GRAM-5.86 GRAM SOLUTION: ORAL | 1 days supply | Qty: 4000 | Fill #0 | Status: CP

## 2023-05-10 NOTE — Telephone Encounter
 Patient is scheduled for COLONOSCOPY, FLEXIBLE; DX, W/WO SPECIMENS/COLON DECOMP (SEP PROC)  on 05/12/2023 at [facility]) . NPO instructions provided per procedure guidelines. Patient instructed to take the following medications dexilant with a sip of water, bring inhalers as on the morning of the scheduled procedure. Golytely bowel prep instructions per YDD referral. Patient received colonoscopy NPO / BP instructions via My Chart on 03/30/2023 . Patient verbalized understanding. Discharge transportation policy and instructions given per pre procedure guidelines.Patient confirms history of diabetes. Patient provided with instructions per the [facility] Pre - Procedure Diabetes Protocol. Teach back method used, patient verbalizes understanding.

## 2023-05-12 ENCOUNTER — Encounter: Admit: 2023-05-12 | Payer: PRIVATE HEALTH INSURANCE | Attending: Hepatology | Primary: Internal Medicine

## 2023-05-12 ENCOUNTER — Ambulatory Visit: Admit: 2023-05-12 | Payer: MEDICAID | Attending: Adult Health | Primary: Internal Medicine

## 2023-05-12 ENCOUNTER — Encounter: Admit: 2023-05-12 | Payer: MEDICAID | Primary: Internal Medicine

## 2023-05-12 ENCOUNTER — Inpatient Hospital Stay: Admit: 2023-05-12 | Discharge: 2023-05-12 | Payer: MEDICAID | Attending: Hepatology | Primary: Internal Medicine

## 2023-05-12 DIAGNOSIS — Z860101 Personal history of adenomatous and serrated colon polyps: Secondary | ICD-10-CM

## 2023-05-12 DIAGNOSIS — Z91041 Radiographic dye allergy status: Secondary | ICD-10-CM

## 2023-05-12 DIAGNOSIS — M1990 Unspecified osteoarthritis, unspecified site: Secondary | ICD-10-CM

## 2023-05-12 DIAGNOSIS — Z9049 Acquired absence of other specified parts of digestive tract: Secondary | ICD-10-CM

## 2023-05-12 DIAGNOSIS — Z1211 Encounter for screening for malignant neoplasm of colon: Secondary | ICD-10-CM

## 2023-05-12 DIAGNOSIS — R112 Nausea with vomiting, unspecified: Secondary | ICD-10-CM

## 2023-05-12 DIAGNOSIS — Z860100 Personal history of colon polyps, unspecified: Secondary | ICD-10-CM

## 2023-05-12 DIAGNOSIS — K219 Gastro-esophageal reflux disease without esophagitis: Secondary | ICD-10-CM

## 2023-05-12 DIAGNOSIS — IMO0002 Cystocele: Secondary | ICD-10-CM

## 2023-05-12 DIAGNOSIS — F4310 Post-traumatic stress disorder, unspecified: Secondary | ICD-10-CM

## 2023-05-12 DIAGNOSIS — Z823 Family history of stroke: Secondary | ICD-10-CM

## 2023-05-12 DIAGNOSIS — K635 Polyp of colon: Secondary | ICD-10-CM

## 2023-05-12 DIAGNOSIS — U071 COVID-19 virus infection: Secondary | ICD-10-CM

## 2023-05-12 DIAGNOSIS — Z8249 Family history of ischemic heart disease and other diseases of the circulatory system: Secondary | ICD-10-CM

## 2023-05-12 DIAGNOSIS — J309 Allergic rhinitis, unspecified: Secondary | ICD-10-CM

## 2023-05-12 DIAGNOSIS — M4180 Other forms of scoliosis, site unspecified: Secondary | ICD-10-CM

## 2023-05-12 DIAGNOSIS — Z833 Family history of diabetes mellitus: Secondary | ICD-10-CM

## 2023-05-12 DIAGNOSIS — E119 Type 2 diabetes mellitus without complications: Secondary | ICD-10-CM

## 2023-05-12 DIAGNOSIS — F419 Anxiety disorder, unspecified: Secondary | ICD-10-CM

## 2023-05-12 DIAGNOSIS — J45909 Unspecified asthma, uncomplicated: Secondary | ICD-10-CM

## 2023-05-12 DIAGNOSIS — Z8616 Personal history of COVID-19: Secondary | ICD-10-CM

## 2023-05-12 DIAGNOSIS — Z888 Allergy status to other drugs, medicaments and biological substances status: Secondary | ICD-10-CM

## 2023-05-12 DIAGNOSIS — Z794 Long term (current) use of insulin: Secondary | ICD-10-CM

## 2023-05-12 DIAGNOSIS — D219 Benign neoplasm of connective and other soft tissue, unspecified: Secondary | ICD-10-CM

## 2023-05-12 DIAGNOSIS — Z90721 Acquired absence of ovaries, unilateral: Secondary | ICD-10-CM

## 2023-05-12 DIAGNOSIS — Z90710 Acquired absence of both cervix and uterus: Secondary | ICD-10-CM

## 2023-05-12 DIAGNOSIS — X008XXA Other exposure to uncontrolled fire in building or structure, initial encounter: Secondary | ICD-10-CM

## 2023-05-12 MED ORDER — PROPOFOL 10 MG/ML INTRAVENOUS EMULSION
10 | INTRAVENOUS | Status: DC | PRN
Start: 2023-05-12 — End: 2023-05-12
  Administered 2023-05-12: 13:00:00 10 mg/mL via INTRAVENOUS

## 2023-05-12 MED ORDER — LACTATED RINGERS INTRAVENOUS SOLUTION
INTRAVENOUS | Status: DC
Start: 2023-05-12 — End: 2023-05-12

## 2023-05-12 MED ORDER — SODIUM CHLORIDE 0.9 % (FLUSH) INJECTION SYRINGE
0.9 | INTRAVENOUS | Status: DC | PRN
Start: 2023-05-12 — End: 2023-05-12

## 2023-05-12 MED ORDER — SODIUM CHLORIDE 0.9 % (FLUSH) INJECTION SYRINGE
0.9 | Freq: Three times a day (TID) | INTRAVENOUS | Status: DC
Start: 2023-05-12 — End: 2023-05-12

## 2023-05-12 MED ORDER — PROPOFOL 10 MG/ML INTRAVENOUS EMULSION
10 | Status: DC | PRN
Start: 2023-05-12 — End: 2023-05-12
  Administered 2023-05-12: 13:00:00 10 mL/h

## 2023-05-12 MED ORDER — LIDOCAINE (PF) 20 MG/ML (2 %) INJECTION SOLUTION
20 | INTRAVENOUS | Status: DC | PRN
Start: 2023-05-12 — End: 2023-05-12
  Administered 2023-05-12: 13:00:00 20 mg/mL (2 %) via INTRAVENOUS

## 2023-05-12 NOTE — Other
 Post Anesthesia Transfer of Care NotePatient: Lynn Reed) Performed: Procedure(s) (LRB):COLONOSCOPY, FLEXIBLE; DX, W/WO SPECIMENS/COLON DECOMP (SEP PROC) (N/A)Last Vitals: I have reviewed the post-operative vital signs during the handoff as noted in the Epic chart.POSTOP HANDOFF :      Patient Location:  PACU     Level of Consciousness:  Lethargic     VS stable since last recorded intra-op set? Yes       Oxygen source: room airPatient co-morbidities, intra-operative course, intake & output and antibiotics as per Anesthesia record were discussed with the RN.

## 2023-05-12 NOTE — Other
 Operative Diagnosis:Pre-op:   * No pre-op diagnosis entered * Patient Coded Diagnosis   Pre-op diagnosis: Hx of colonic polyps, Screening for colon cancer  Post-op diagnosis: Hx of colonic polyps, Screening for colon cancer  Patient Diagnosis   None    Post-op diagnosis:   * Hx of colonic polyps [Z86.0100]   * Screening for colon cancer [Z12.11]Operative Procedure(s) :Procedure(s) (LRB):COLONOSCOPY, FLEXIBLE; DX, W/WO SPECIMENS/COLON DECOMP (SEP PROC) (N/A)Post-op Procedure & Diagnosis ConfirmationPost-op Diagnosis: Post-op Diagnosis confirmed (no changes)Post-op Procedure: Post-op Procedure confirmed (no changes)Anesthesia ClarifiersGI/Endoscopy: Planned Screening Colonoscopy -  Normal Colon

## 2023-05-12 NOTE — Anesthesia Post-Procedure Evaluation
 Anesthesia Post-op NotePatient: Lynn Davenport SantosProcedure(s):  Procedure(s) (LRB):COLONOSCOPY, FLEXIBLE; DX, W/WO SPECIMENS/COLON DECOMP (SEP PROC) (N/A) Last Vitals:  I have reviewed the post-operative vital signs as noted in the Epic chart.POSTOP EVALUATION:      Patient Recovery Location:  PACU     Vital Signs Status:  Stable     Patient Participation:  Patient participated     Mental Status:  Awake, alert and oriented     Respiratory Status:  Acceptable     Airway Patency:  Patent     Cardiovascular/Hydration Status:  Stable     Pain Management:  Satisfactory to patient     Nausea/Vomiting Status:  Satisfactory to patientThere were no known notable events for this encounter.
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
## 2023-05-12 NOTE — Anesthesia Pre-Procedure Evaluation
 This is a 62 y.o. female scheduled for COLONOSCOPY, FLEXIBLE; DX, W/WO SPECIMENS/COLON DECOMP (SEP PROC).Review of Systems/ Medical History Patient summary, nursing notes, Labs, pre-procedure vitals, height, weight and NPO status reviewed.No previous anesthesia concernsAnesthesia Evaluation:   History of anesthetic complications: Hx of PONV.  Perioperative Screening ToolPONV Screening risk factors: demonstrated PONV.Estimated body mass index.05/12/23 : 26.05 kg/m? Last patient weight recorded. 05/12/23 : 73.2 kg Last patient height recorded. 05/12/23 : 5' 6 (1.676 m) CC/HPI: 61y/o female with Hx of colonic polyps presents for COLONOSCOPY, FLEXIBLE; DX, W/WO SPECIMENS/COLON DECOMPPast Medical History:No date: Allergic rhinitis    Comment:  dust, trees,No date: AnxietyNo date: Arthritis    Comment:  kneesNo date: Asthma    Comment:  rescue inhaler 1-2 times a weekNo date: Colon polyp    Comment:  tubular adenoma11/17/2020: COVID-19 virus infectionNo date: CystoceleNo date: DextroscoliosisNo date: Diabetes mellitus, type 2 (HC Code)10/2010: Explosion caused by conflagration in private dwellingNo date: FibroidNo date: GERD (gastroesophageal reflux disease)    Comment:  not well controlledNo date: PONV (postoperative nausea and vomiting)No date: PTSD (post-traumatic stress disorder)CC.  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  No angina.  No dyspnea. -Exercise tolerance: >4 METS -Vascular Disease:  Negative    Respiratory:      -Nicotine Dependence: no-Airway disorders:      -Asthma: yesHEENT: Negative.Neuromuscular:  Patient has a history of  no seizures.-Intracranial disorders:  She did not have a cerebrovascular accident  -Neuropathy: peripheral neuropathy-Muscle disorders: neuromuscular disease (chronic neck pain) -Comments: Foot Pain, has diabetic neuropathySpine/Spinal Cord Disorders:  History of scoliosis.Gastrointestinal/Genitourinary: -Gastrointestinal Disorders:  Patient has GERD.-Comments: Hx of CystoceleEndocrine/Metabolic: -Diabetes mellitus:  Patient has diabetes mellitus (HgbA1C  7.2) type 2.Behavioral/Social/Psychiatric & Syndromes:  Patient has psychiatric history (PTSD).Additional Findings: BG 116Physical ExamCardiovascular:    normal exam  Rhythm: regularHeart Sounds: S1 present and S2 present.Pulmonary:  normal exam  Patient's breath sounds clear to auscultationAirway:  Mallampati: ITM distance: >3 FBNeck ROM: fullMouth Opening: >3cmDental:  Dentition: capsAnesthesia PlanASA 3 The primary anesthesia plan is  MAC. Standard monitors, peripheral IVs Natural airway - device if indicated for patient safety/comfortPerioperative Code Status confirmed: It is my understanding that the patient is currently designated as 'Full Code' and will remain so throughout the perioperative period.Anesthesia informed consent obtained. Consent obtained from: patientUse of blood products: consented  The post operative pain plan is per surgeon management and IV analgesics.Opioid administration likely.Plan discussed with CRNA.Anesthesiologist's Pre Op NoteI personally evaluated and examined the patient prior to the intra-operative phase of care on the day of the procedure.Marland Kitchen

## 2023-05-12 NOTE — Other
 Colonoscopy performed under anesthesia. No specimen obtained.

## 2023-05-20 ENCOUNTER — Inpatient Hospital Stay: Admit: 2023-05-20 | Discharge: 2023-05-20 | Payer: PRIVATE HEALTH INSURANCE | Primary: Internal Medicine

## 2023-07-08 ENCOUNTER — Inpatient Hospital Stay: Admit: 2023-07-08 | Discharge: 2023-07-08 | Payer: PRIVATE HEALTH INSURANCE | Primary: Internal Medicine

## 2023-07-12 ENCOUNTER — Inpatient Hospital Stay: Admit: 2023-07-12 | Discharge: 2023-07-12 | Payer: PRIVATE HEALTH INSURANCE | Primary: Internal Medicine

## 2023-09-21 ENCOUNTER — Inpatient Hospital Stay: Admit: 2023-09-21 | Discharge: 2023-09-21 | Payer: PRIVATE HEALTH INSURANCE | Primary: Internal Medicine

## 2023-11-02 ENCOUNTER — Inpatient Hospital Stay: Admit: 2023-11-02 | Discharge: 2023-11-02 | Payer: PRIVATE HEALTH INSURANCE | Primary: Internal Medicine

## 2023-12-07 ENCOUNTER — Inpatient Hospital Stay: Admit: 2023-12-07 | Discharge: 2023-12-07 | Payer: PRIVATE HEALTH INSURANCE | Primary: Internal Medicine

## 2023-12-30 ENCOUNTER — Inpatient Hospital Stay: Admit: 2023-12-30 | Discharge: 2023-12-30 | Payer: MEDICAID | Primary: Internal Medicine

## 2023-12-30 DIAGNOSIS — R053 Chronic cough: Secondary | ICD-10-CM

## 2024-01-05 ENCOUNTER — Inpatient Hospital Stay: Admit: 2024-01-05 | Discharge: 2024-01-05 | Payer: PRIVATE HEALTH INSURANCE | Primary: Internal Medicine

## 2024-02-16 ENCOUNTER — Inpatient Hospital Stay: Admit: 2024-02-16 | Discharge: 2024-02-16 | Payer: PRIVATE HEALTH INSURANCE | Primary: Internal Medicine

## 2024-02-29 IMAGING — MR RM JOELHO ESQ
3 of 8 series · 9 of 40 positions shown · non-contrast
Comparison: none

[Series 4: T1 · sagittal · 3.5mm · 0.18mm/px · 3 of 28 slices shown]
[im 6/28]
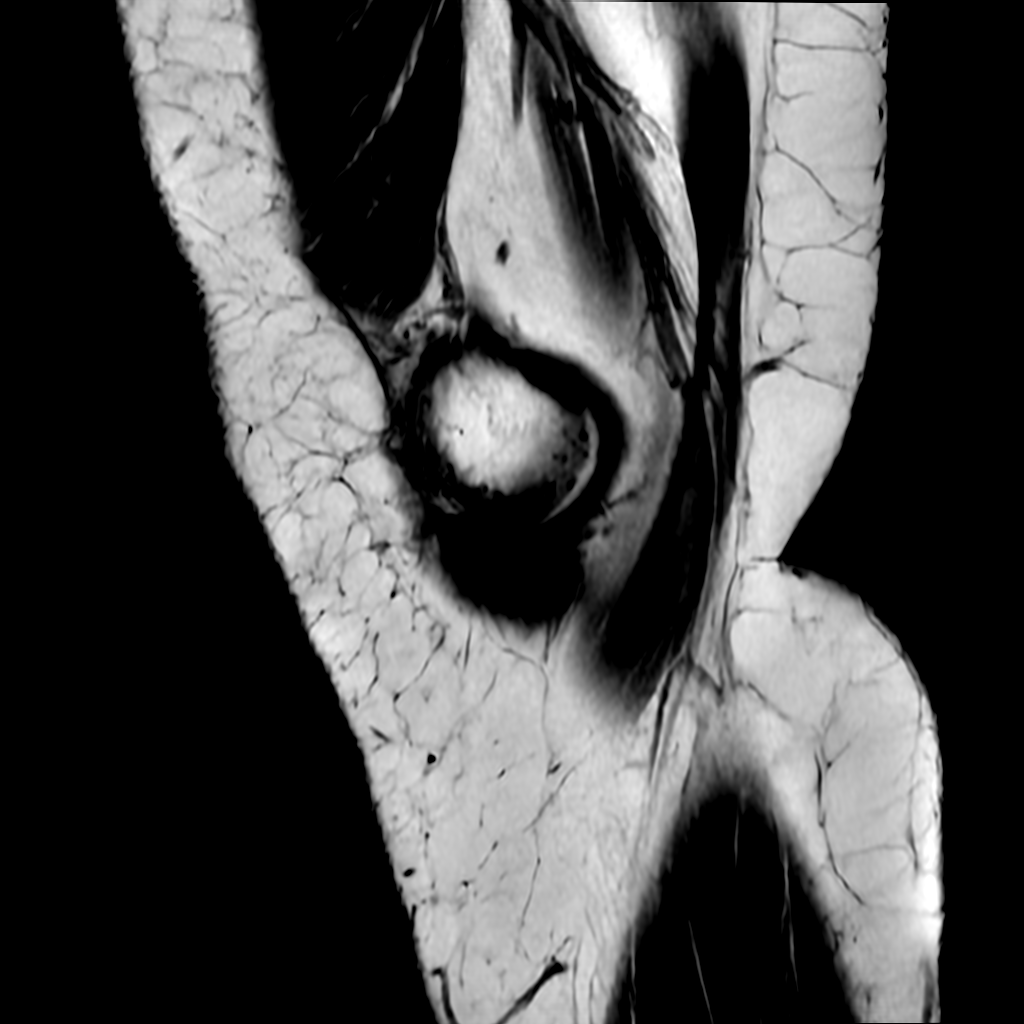
[im 17/28]
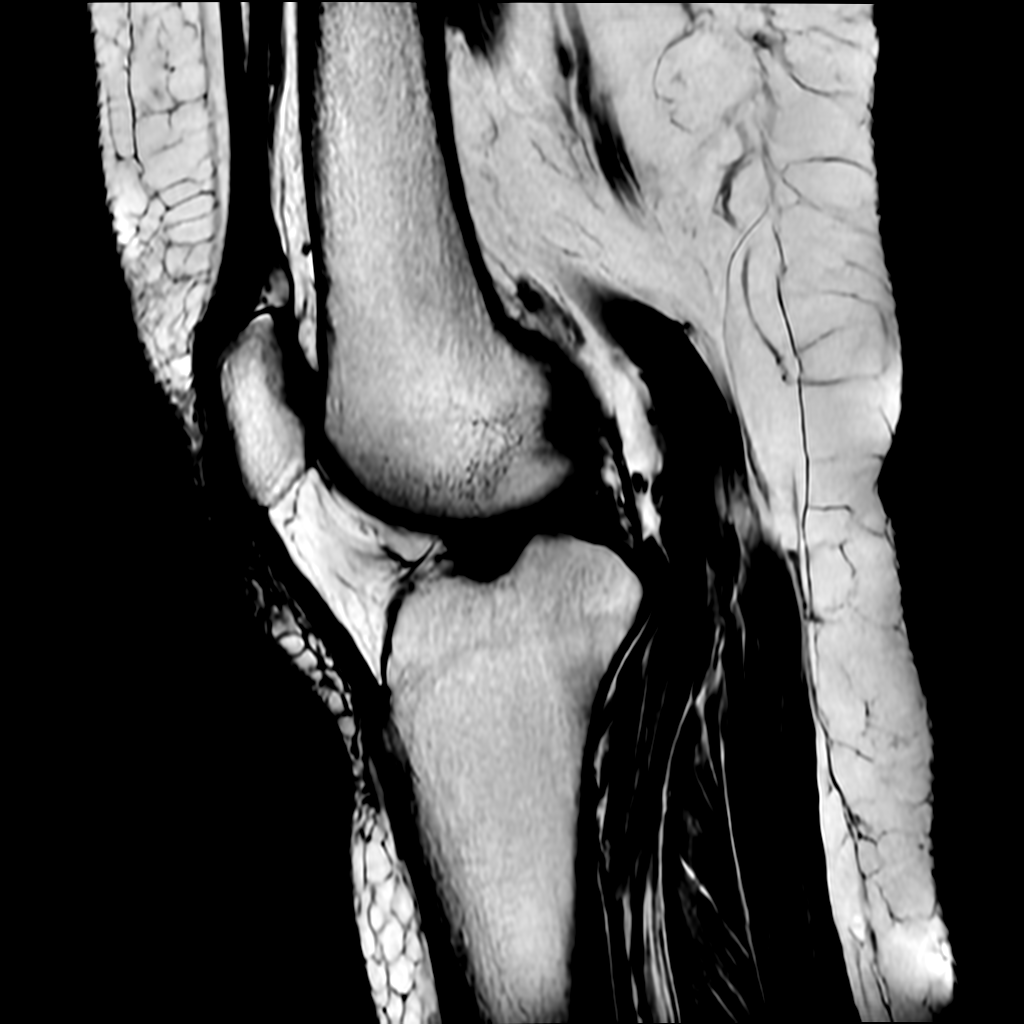
[im 28/28]
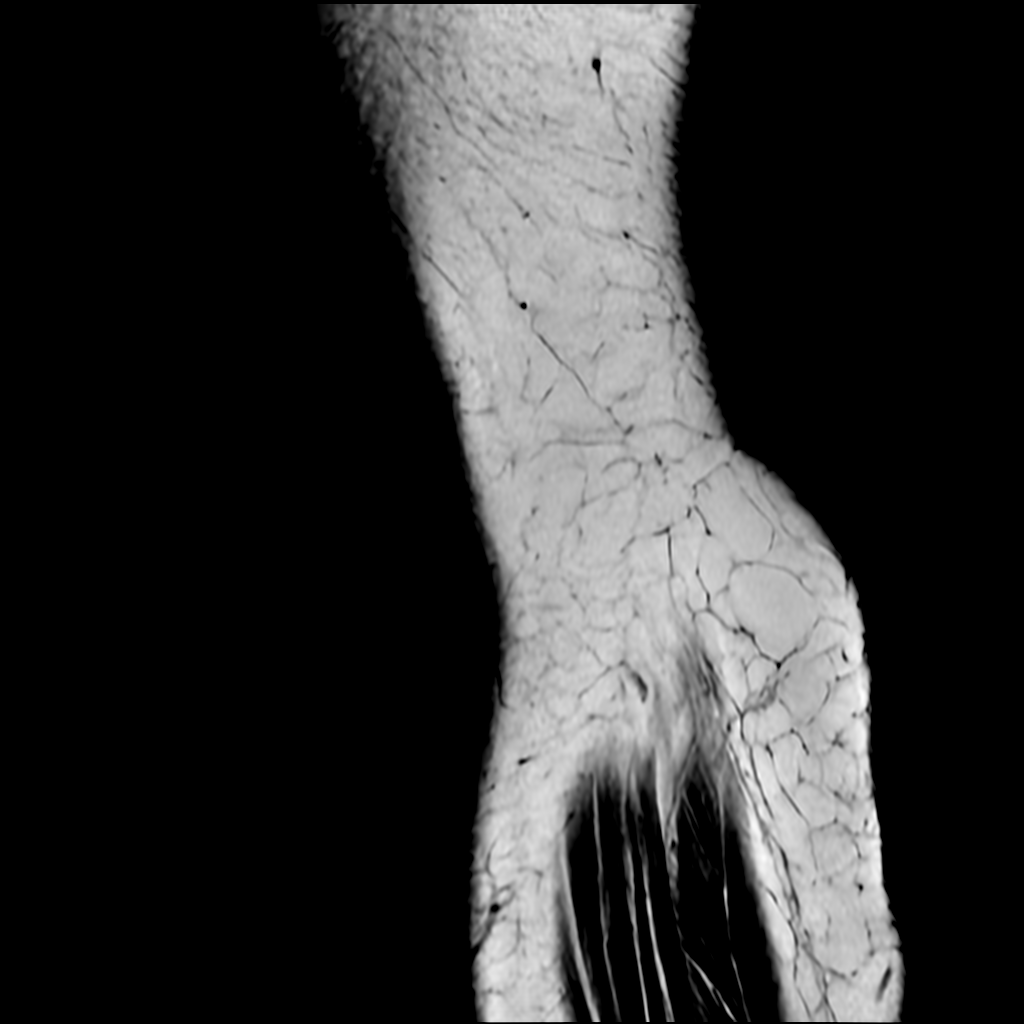

[Series 5: T2 fat-sat · coronal · 3.5mm · 0.33mm/px · 3 of 28 slices shown (1 of 2)]
[im 6/28]
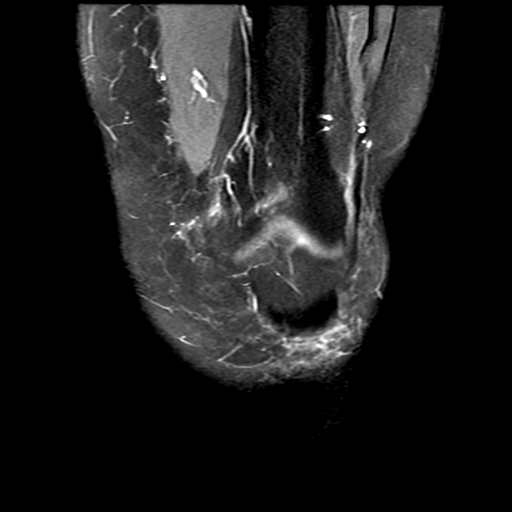
[im 17/28]
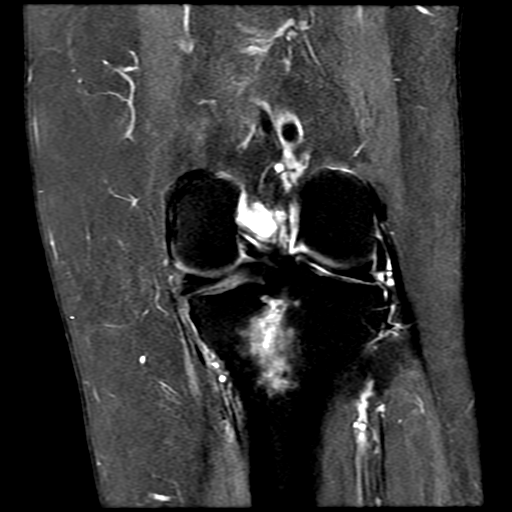
[im 28/28]
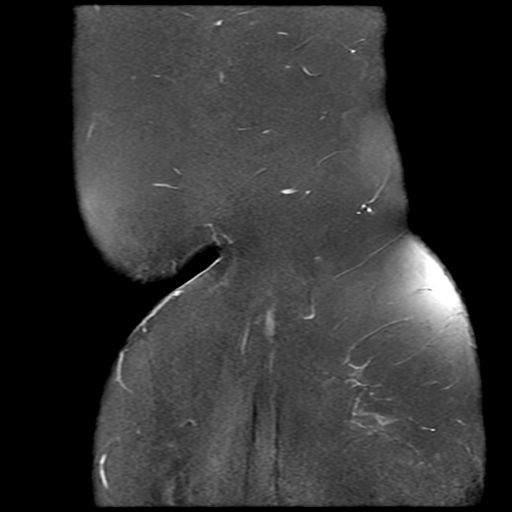

[Series 7: T2 fat-sat · axial · 4.0mm · 0.35mm/px · z∈[-46,+60]mm · 3 of 30 slices shown (2 of 2)]
[im 6/30]
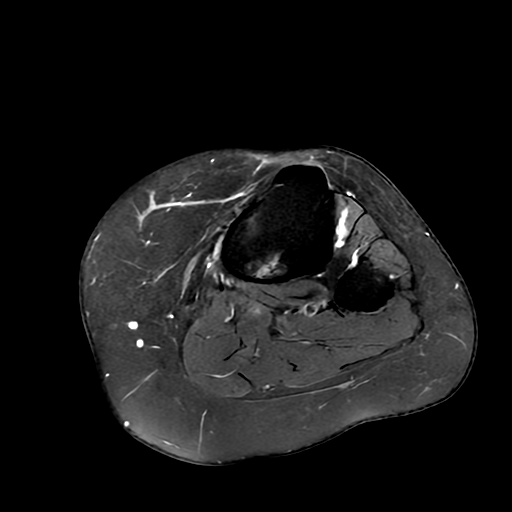
[im 18/30]
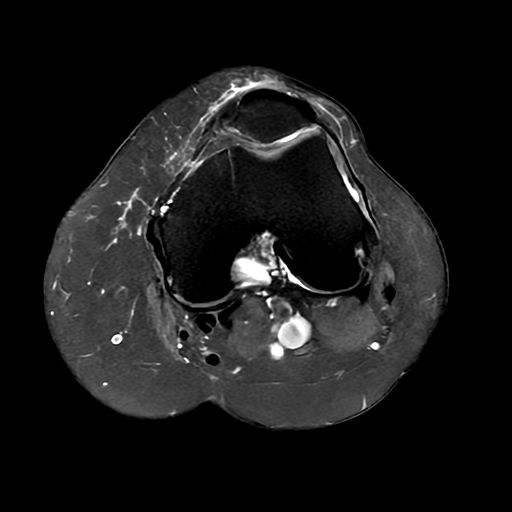
[im 30/30]
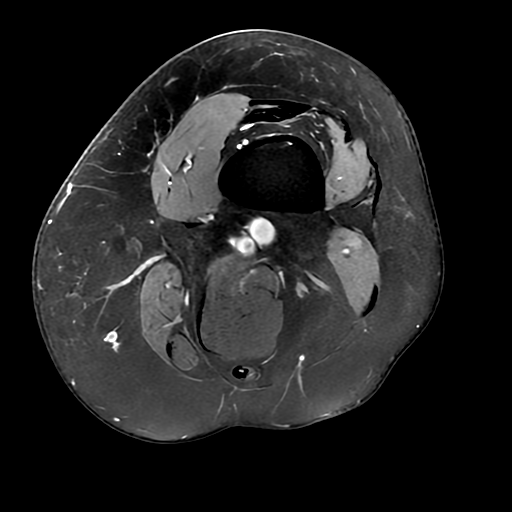

[9 of 40 positions shown; findings below may reference images not displayed]

Técnica:  Exame  obtido  com  sequências  Spin  Echo  ponderadas  em  T1  e  T2,  sem
injeção do contraste paramagnético.

RESSONÂNCIA MAGNÉTICA DO JOELHO ESQUERDO
Relatório:
Menisco  medial levemente  subluxado c o m alteração  de  sinal  no  corno  posterior,
atingindo  a  superfície  articular  inferior,  sugerindo  ruptura.  Nota-se  ainda  possível
deslocamento meniscal inferior junto ao platô tibial medial ao nível do corpo.
Menisco  lateral  com  alteração  degenerativa  em  seu  interior,  sem  sinais  de  rupturas
instáveis.
Ligamentos cruzados sem alterações.
Ligamentos colaterais e retináculos patelares sem alterações.
Cartilagem  patelo-femoral  com  leve  irregularidade  do  contorno  e  alteração  de  sinal
condral, sem  sinais  de  envolvimento  ósseo  subcondral,  sugerindo  condropatia
degenerativa.
Edema  na  gordura  suprapatelar,  comumente  relacionado  a  disfunção  do  mecanismo
extensor.
Gordura de Hoffa sem alterações significativas.
Sinais  de  alteração  degenerativa nos  compartimentos  femorotibiais 
lateral  e
notadamente 
medial, 
caracterizado 
por osteófitos 
marginais
femorotibiais, irregularidade do contorno e alteração de sinal condral, notadamente em
compartimento medial, neste com redução do espaço articular com áreas de afilamento
da cartilagem.
Sinais de edema ósseo na tíbia proximal, de provável origem mecânica / degenerativa.
Pequena efusão articular com sinais de leve sinovite.
Tendões quadríceps e patelar com espessura e sinal normais.
Sinais  de  leve  tendinose  na  origem  do  gastrocnêmio  medial  e  na  inserção  do
semimembranoso.
Sinais de leve bursite / peritendinite de pata anserina.
Fossa poplítea sem alterações significativas.

Impressão Diagnóstica:
Leve  subluxação  do  menisco  medial  com 
ruptura  no  corno  posterior  e
possível deslocamento meniscal inferior junto ao platô tibial medial ao nível do corpo.
Alteração degenerativa no menisco lateral, sem rupturas instáveis.
Condropatia degenerativa patelo-femoral, sem envolvimento ósseo subcondral.
Edema  na  gordura  suprapatelar,  comumente  relacionado  a  disfunção  do  mecanismo
extensor.
Alteração  degenerativa nos  compartimentos  femorotibiais  lateral  e  notadamente
medial.
Edema ósseo na tíbia proximal, de provável origem mecânica / degenerativa.
Pequena efusão articular com leve sinovite.
Sinais  de  leve  tendinose  na  origem  do  gastrocnêmio  medial  e  na  inserção  do
semimembranoso.
Sinais de leve bursite / peritendinite de pata anserina.

## 2024-02-29 IMAGING — MR RM JOELHO DIR
3 of 8 series · 9 of 40 positions shown · non-contrast
Comparison: none

[Series 4: T1 · sagittal · 3.5mm · 0.18mm/px · 3 of 28 slices shown]
[im 6/28]
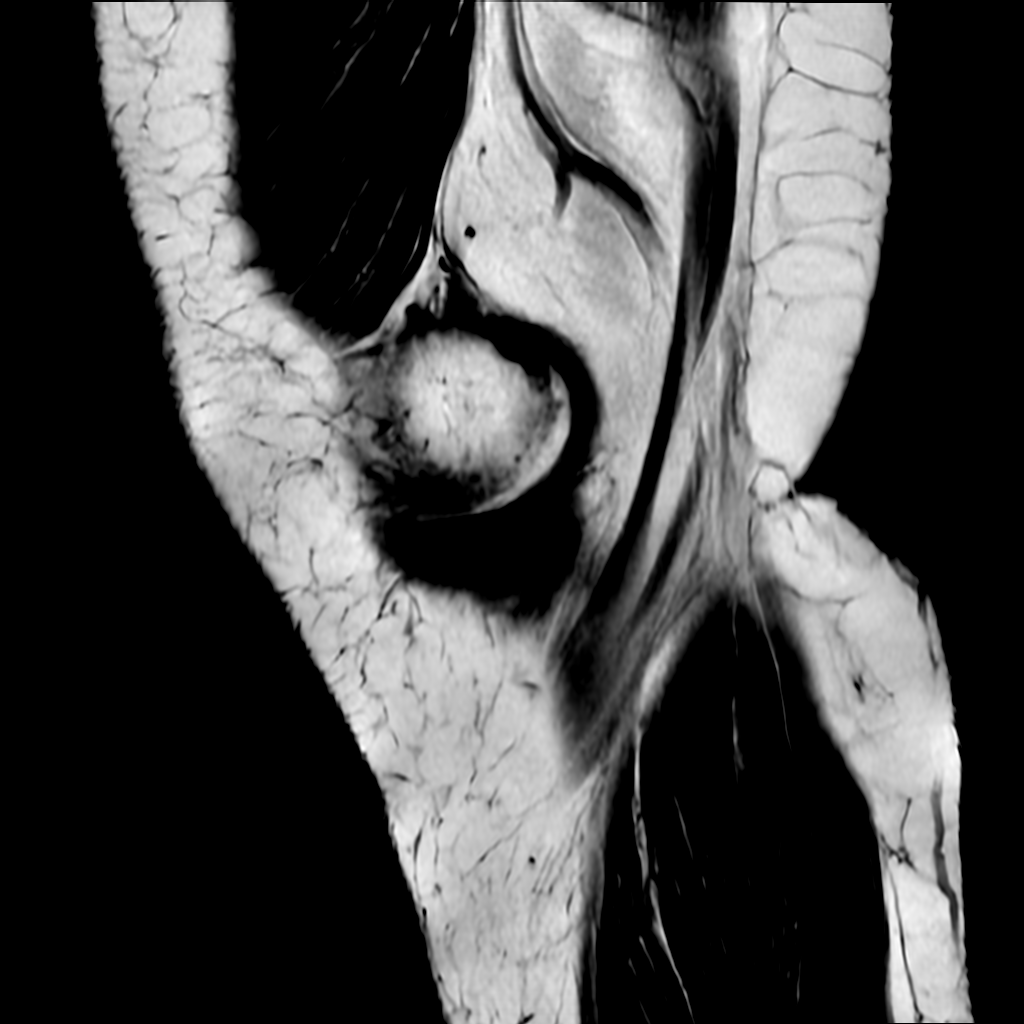
[im 17/28]
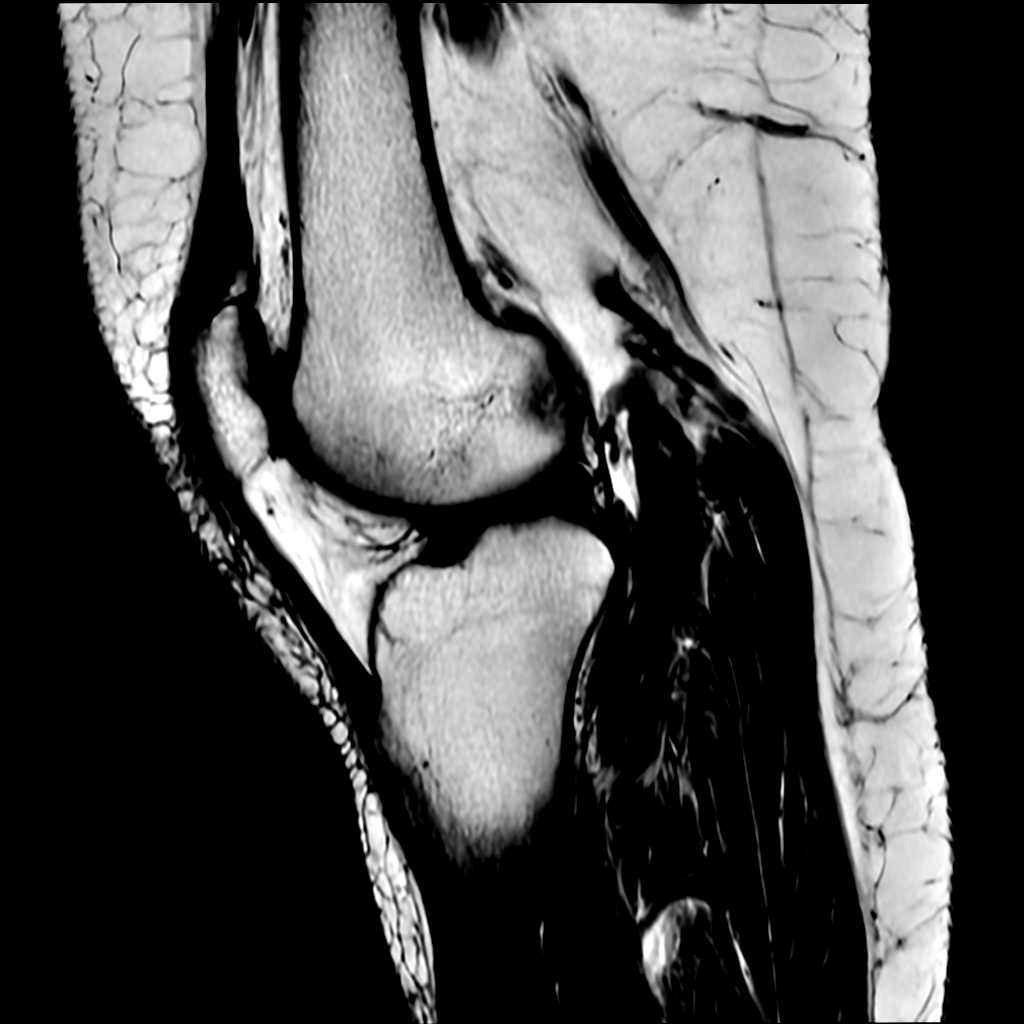
[im 28/28]
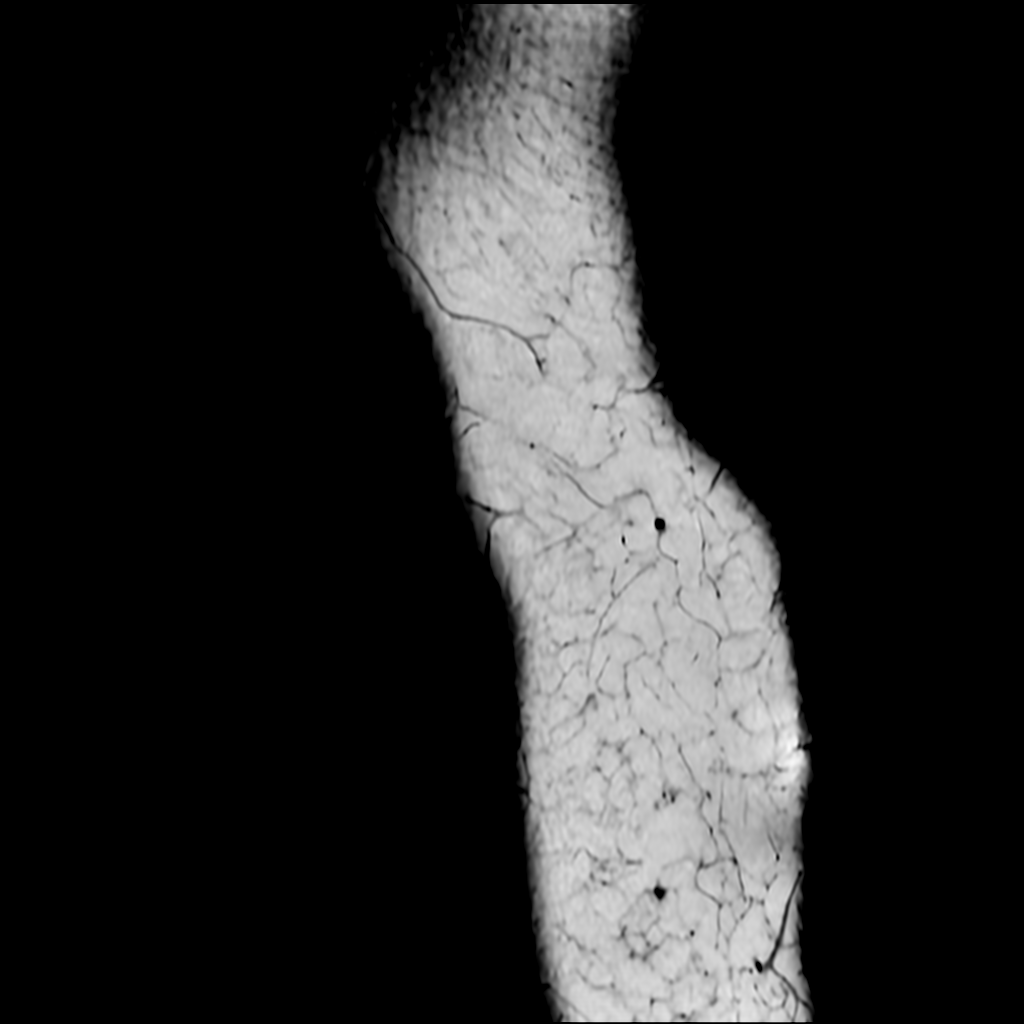

[Series 5: T2 fat-sat · coronal · 3.5mm · 0.33mm/px · 3 of 28 slices shown (1 of 2)]
[im 6/28]
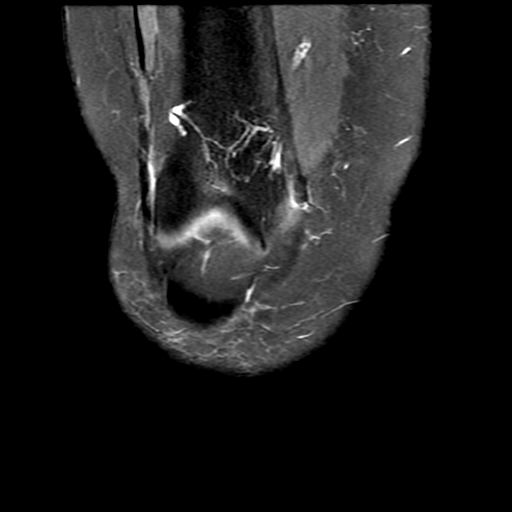
[im 17/28]
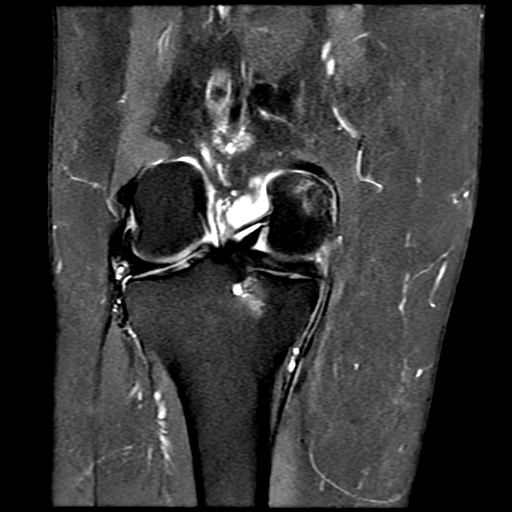
[im 28/28]
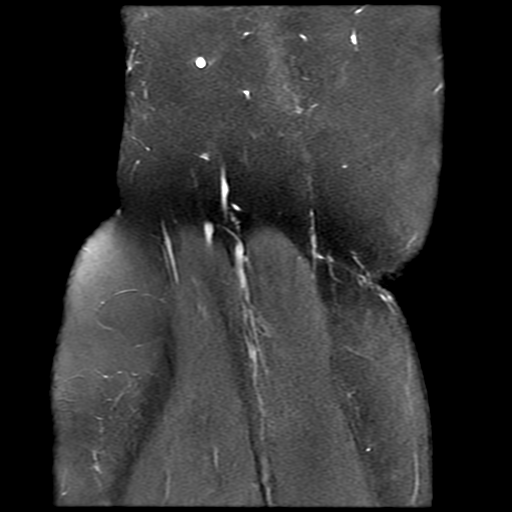

[Series 7: T2 fat-sat · axial · 4.0mm · 0.35mm/px · z∈[-3,+102]mm · 3 of 30 slices shown (2 of 2)]
[im 6/30]
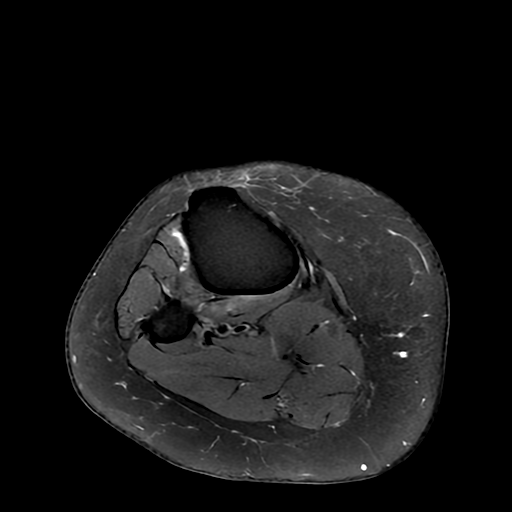
[im 18/30]
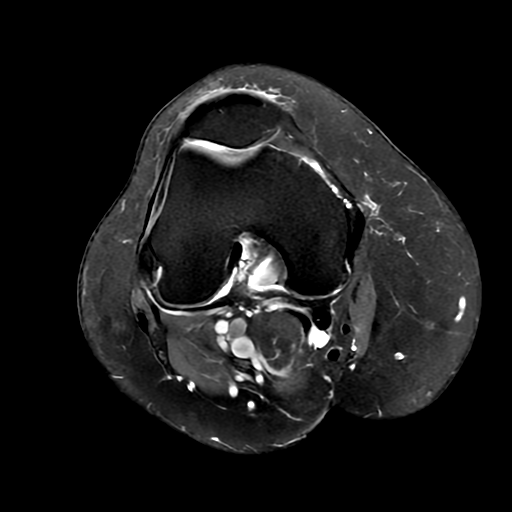
[im 30/30]
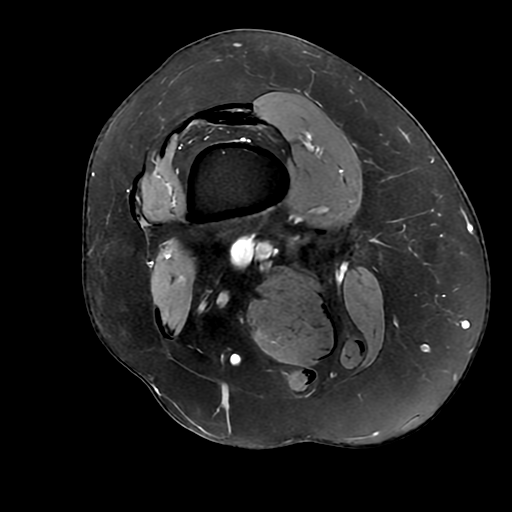

[9 of 40 positions shown; findings below may reference images not displayed]

Técnica:  Exame  obtido  com  sequências  Spin  Echo  ponderadas  em  T1  e  T2,  sem
injeção do contraste paramagnético.

Relatório:
RESSONÂNCIA MAGNÉTICA DO JOELHO DIREITO
Menisco  medial levemente  subluxado com sinais  de  amputação  da  margem  livre  no
corpo  e  no  corno  posterior,  além  de  alteração  de  sinal  intrameniscal,  sugerindo
alteração pós-meniscectomia / degenerativa.
Menisco  lateral  com  alteração  degenerativa  em  seu  interior,  sem  sinais  de  rupturas
instáveis.
Ligamentos cruzados sem alterações.
Ligamentos colaterais e retináculos patelares sem alterações.
Cartilagem  patelo-femoral  com  leve  irregularidade  do  contorno  e  alteração  de  sinal
condral, observando  leve  edema  ósseo  subcondral  na  patela,  sugerindo  condropatia
degenerativa.
Edema  na  gordura  suprapatelar,  comumente  relacionado  a  disfunção  do  mecanismo
extensor.
Sinais  de  alteração  degenerativa nos  compartimentos  femorotibiais 
lateral  e
notadamente 
medial, 
caracterizado 
por osteófitos 
marginais
femorotibiais, irregularidade do contorno e alteração de sinal condral, notadamente em
compartimento medial, neste com redução do espaço articular com áreas de afilamento
da cartilagem com cistos e edema ósseo subcondral.
Alteração  fibrocicatricial  de  permeio  na  gordura  de  Hoffa,  notadamente  no  aspecto
medial, presumivelmente relacionado ao procedimento cirúrgico prévio / artroscopia.
Pequena efusão articular com sinais de leve sinovite.
Tendões quadríceps e patelar com espessura e sinal normais.
Sinais  de  leve  tendinose  na  origem  do  gastrocnêmio  medial  e  na  inserção  do
semimembranoso.
Sinais de leve bursite / peritendinite de pata anserina.
Cisto de Baker com leve migração cranial . 

Impressão Diagnóstica:
Leve subluxação do menisco medial com alteração pós-meniscectomia / degenerativa
no corpo e corno posterior, conforme descrito.
Alteração degenerativa no menisco lateral, sem rupturas instáveis.
Condropatia degenerativa patelo-femoral com leve edema ósseo subcondral na patela.
Edema  na  gordura  suprapatelar,  comumente  relacionado  a  disfunção  do  mecanismo
extensor.
Alteração  degenerativa nos  compartimentos  femorotibiais  lateral  e  notadamente
medial.
Alteração  fibrocicatricial  de  permeio  na  gordura  de  Hoffa,  notadamente  no  aspecto
medial, presumivelmente relacionado ao procedimento cirúrgico prévio / artroscopia.
Pequena efusão articular com leve sinovite.
Sinais  de  leve  tendinose  na  origem  do  gastrocnêmio  medial  e  na  inserção  do
semimembranoso.
Sinais de leve bursite / peritendinite de pata anserina.
Cisto de Baker.

## 2024-03-28 ENCOUNTER — Inpatient Hospital Stay: Admit: 2024-03-28 | Discharge: 2024-03-28 | Payer: PRIVATE HEALTH INSURANCE | Primary: Internal Medicine

## 2024-04-11 IMAGING — MR RM COLUNA LOMBAR
4 of 5 series · 11 of 48 positions shown · non-contrast
Comparison: none

[Series 2: T2 · sagittal · 4.5mm · 0.55mm/px · 3 of 15 slices shown (1 of 2)]
[im 3/15]
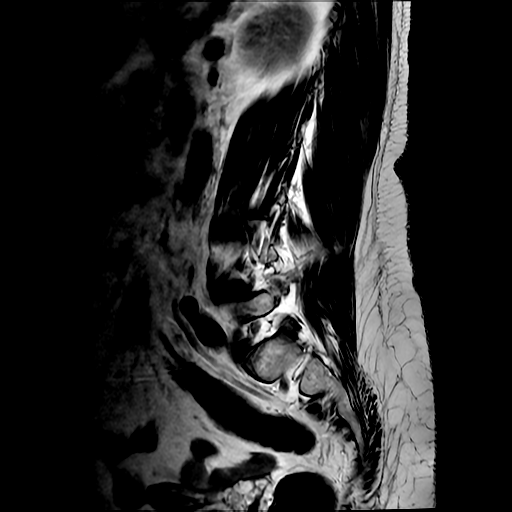
[im 8/15]
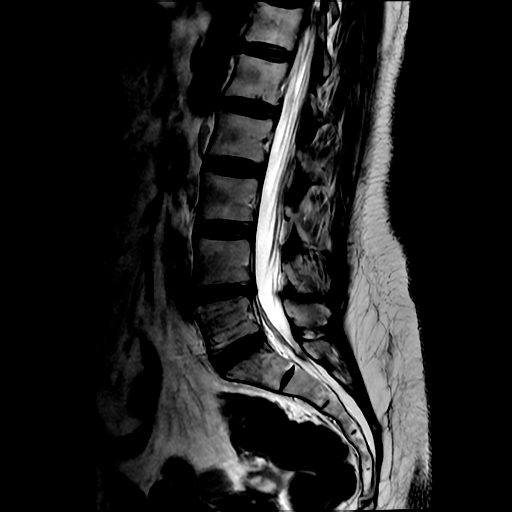
[im 12/15]
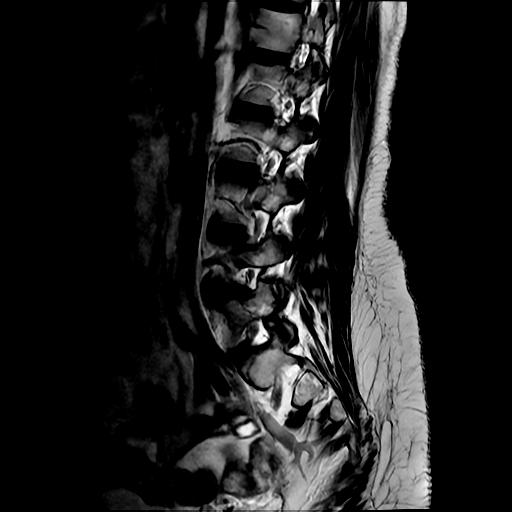

[Series 3: T1 · sagittal · 4.5mm · 0.55mm/px · 3 of 15 slices shown]
[im 3/15]
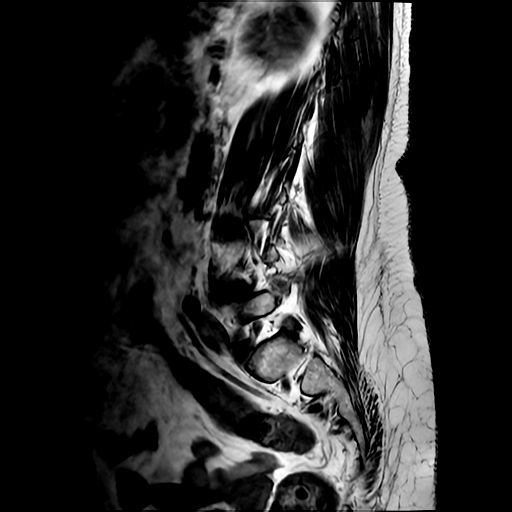
[im 9/15]
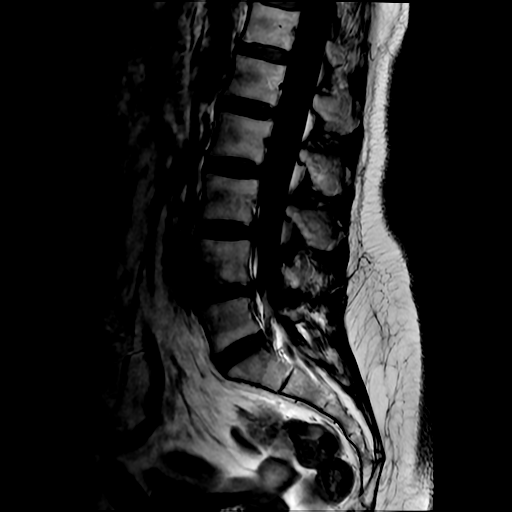
[im 15/15]
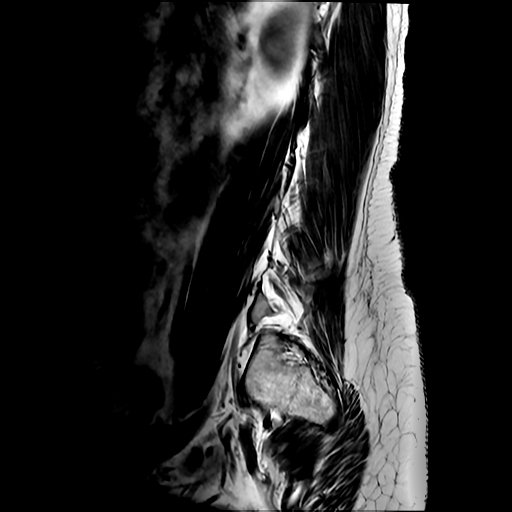

[Series 4: STIR · sagittal · 4.5mm · 0.55mm/px · 2 of 15 slices shown]
[im 3/15]
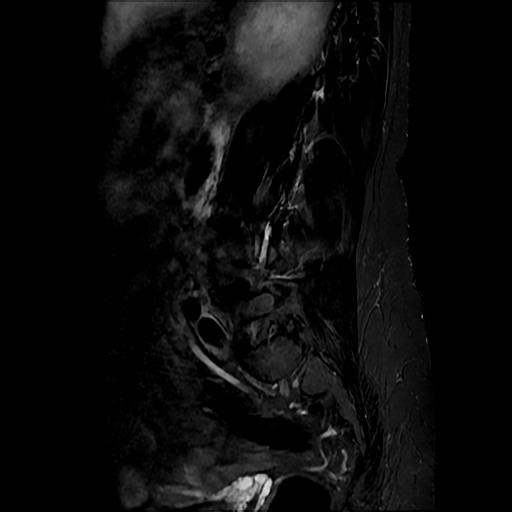
[im 9/15]
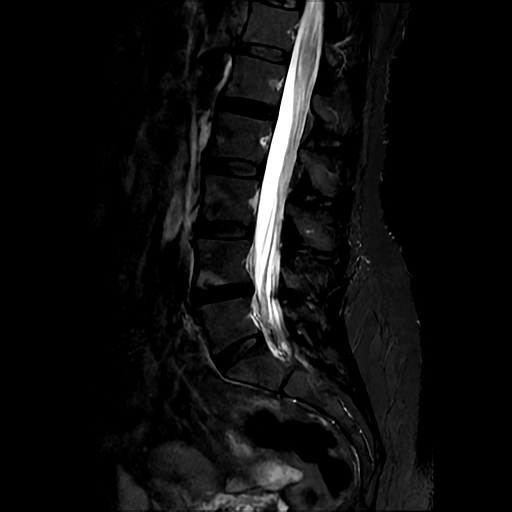

[Series 5: T2 · axial · 4.0mm · 0.16mm/px · z∈[-104,+50]mm · 3 of 50 slices shown (2 of 2)]
[im 8/50]
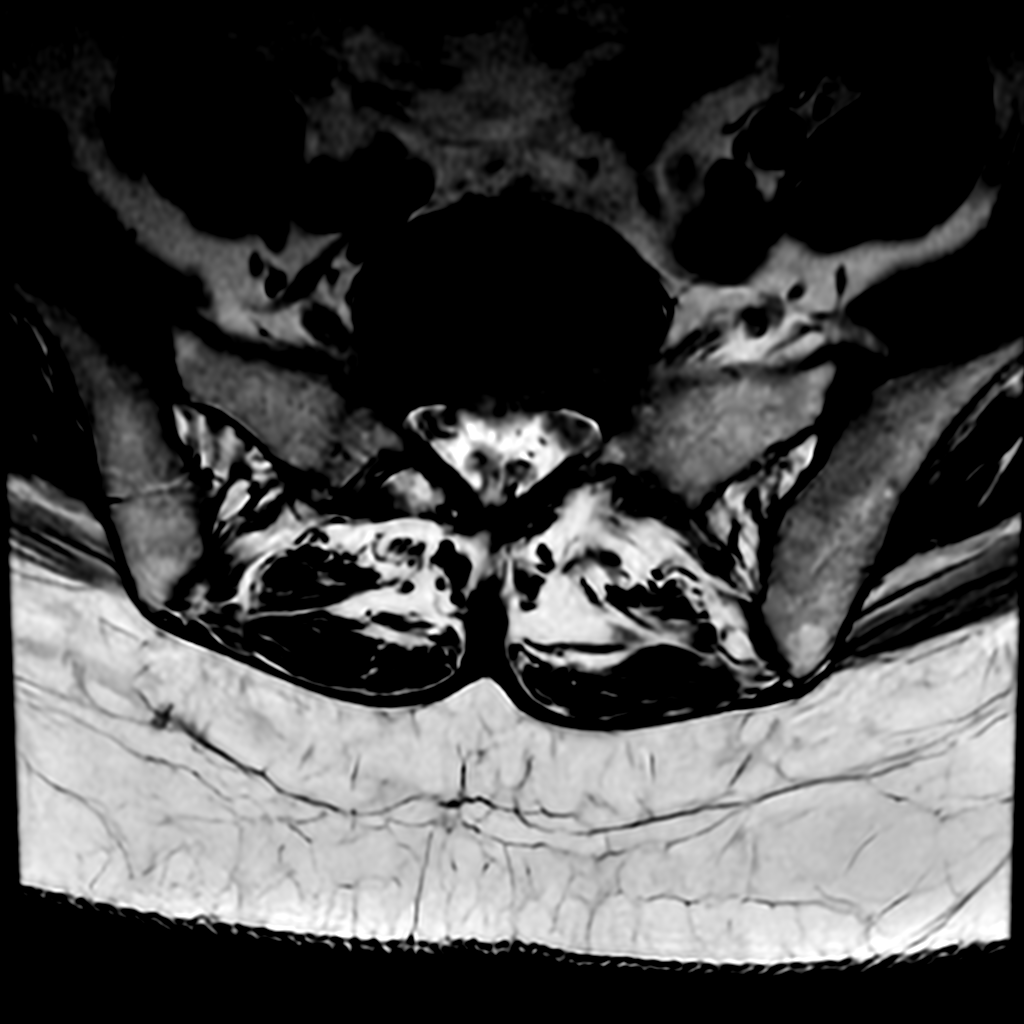
[im 25/50]
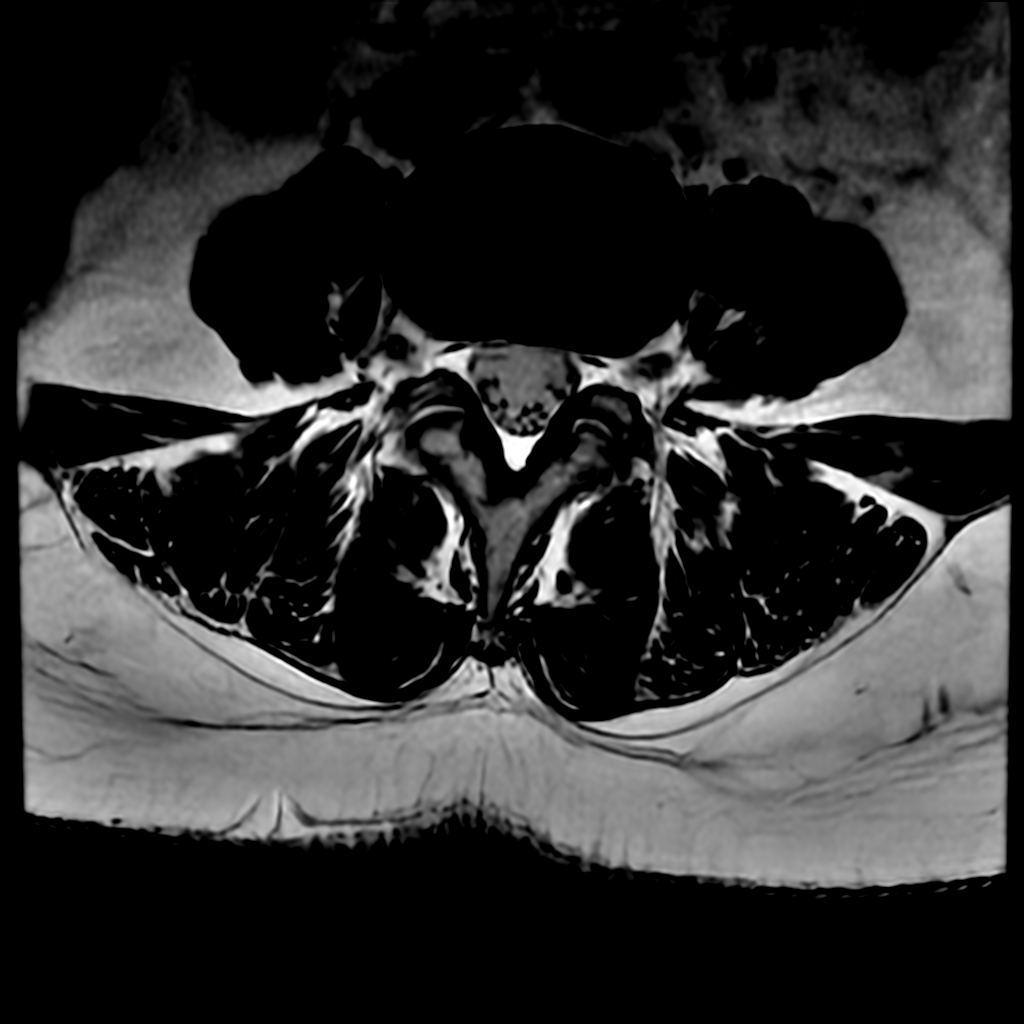
[im 42/50]
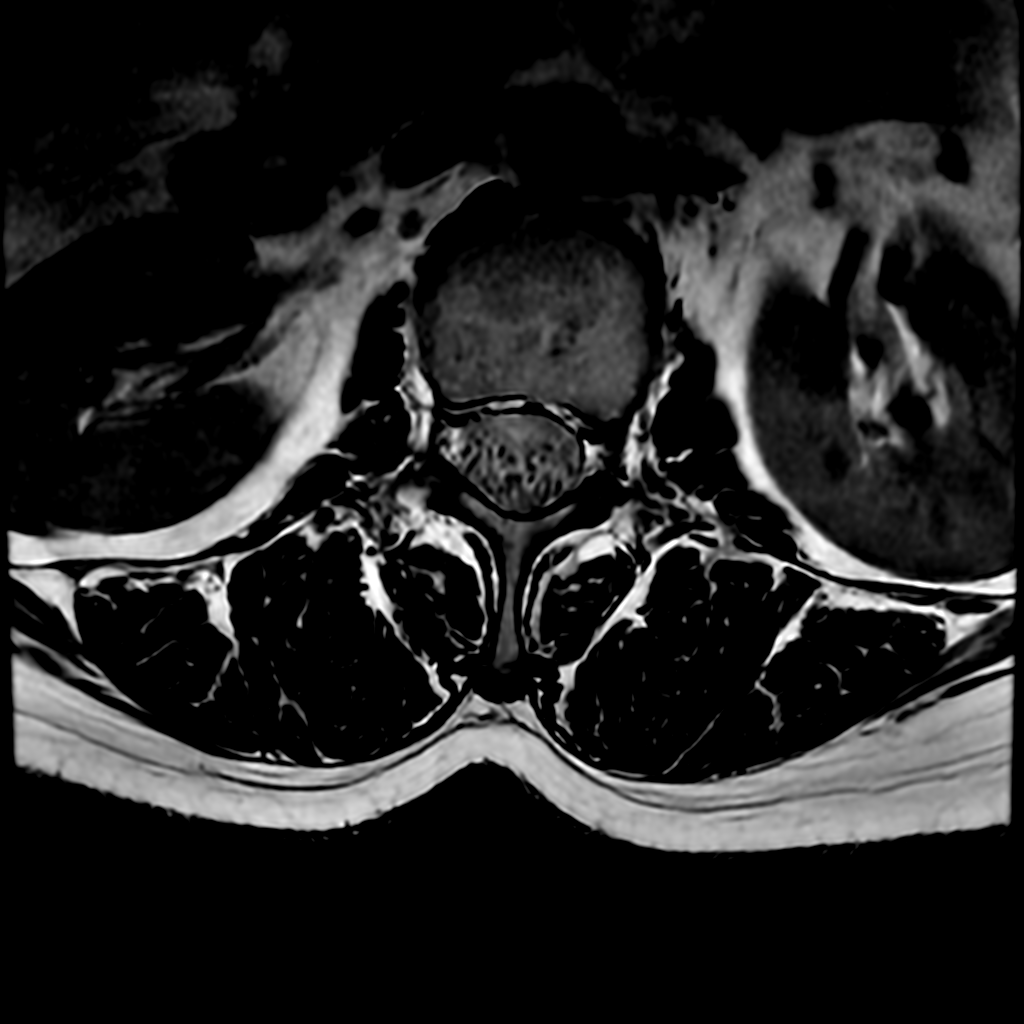

[11 of 48 positions shown; findings below may reference images not displayed]

Técnica: Realizamos sequências ponderadas em T1 e T2 nos planos sagital e axial.

Relatório:
Corpos  vertebrais  com  altura  e  alinhamento  posterior  preservados,  observando
RESSONÂNCIA MAGNÉTICA DA COLUNA LOMBAR
pequenos  osteófitos  marginais  e  pequenas  áreas  de  substituição  adiposa  nas  suas
margens anteriores, relacionado ao processo de espondilose. Nota-se ainda, alteração
degenerativa  discogênica  de  Modic  tipo  I com  pequeno  nódulo  de  Schmorl  no  platô
inferior de L4.
Redução 
de 
intensidade 
de 
sinal 
em  T2 
dos 
discos 
intervertebrais
lombares, notadamente  em  L4-L5,  neste  com  leve  redução  da  altura,  por  processo
degenerativo.
Em  L4-L5:  abaulamento  discal  difuso,  que  apaga  a  gordura  epidural  anterior,
contactando a margem anterior do saco dural com projeção nos forames neurais.
Não há evidências de herniações discais nos demais níveis.
Sinais de sinais de artrose interfacetária em L4-L5 e L5-S1.
Demais elementos posteriores sem alterações.
Canal vertebral e demais forames neurais com dimensões preservadas.
Cone medular com topografia, morfologia e características de sinal normais. Raízes da
cauda equina com distribuição habitual.
Musculatura paravertebral com trofismo preservado.

Impressão Diagnóstica:
Espondiloartrose  lombar  com  abaulamento  discal  difuso  com  projeção  nos  forames
neurais em L4-L5.

## 2024-05-02 ENCOUNTER — Inpatient Hospital Stay: Admit: 2024-05-02 | Discharge: 2024-05-02 | Payer: PRIVATE HEALTH INSURANCE | Primary: Internal Medicine

## 2024-05-16 IMAGING — MR RM JOELHO DIREITO
3 of 8 series · 9 of 40 positions shown · non-contrast
Comparison: none

[Series 4: T1 · sagittal · 3.5mm · 0.18mm/px · 3 of 28 slices shown]
[im 6/28]
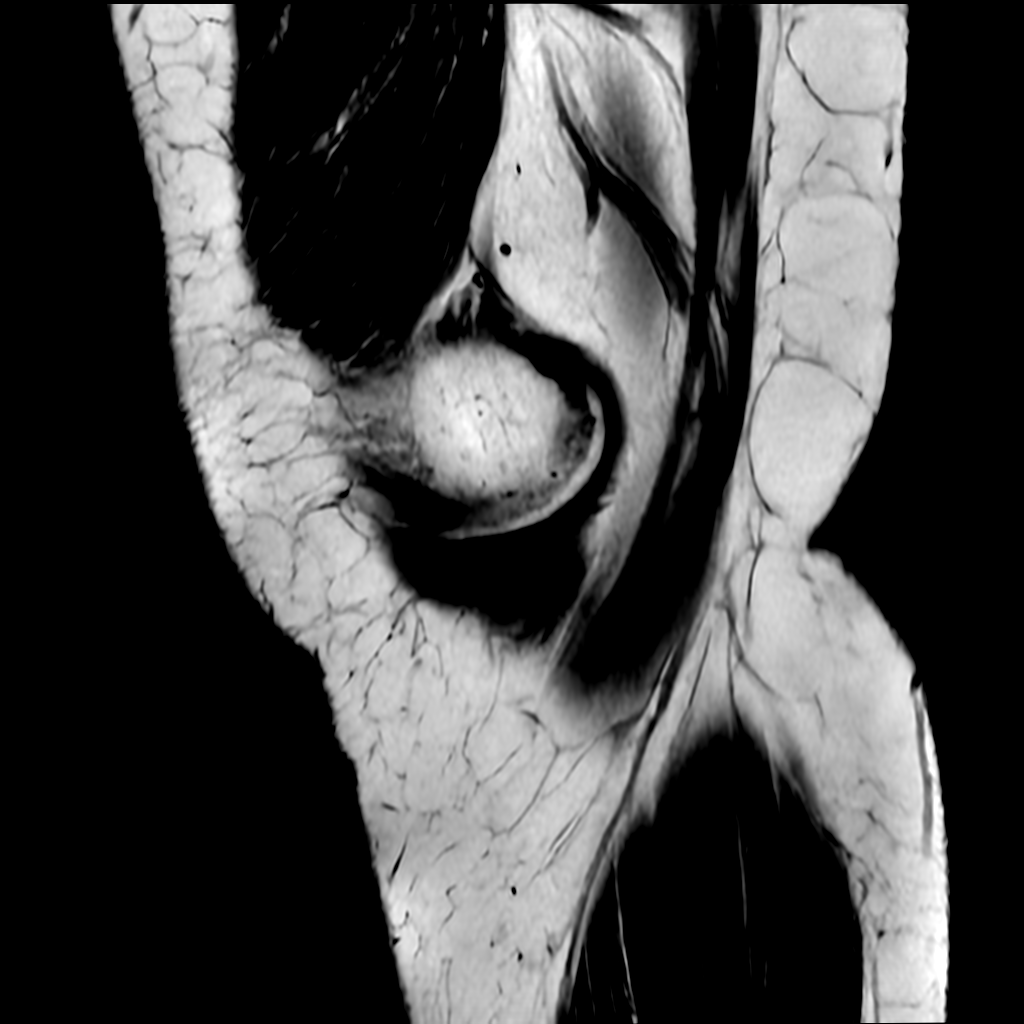
[im 17/28]
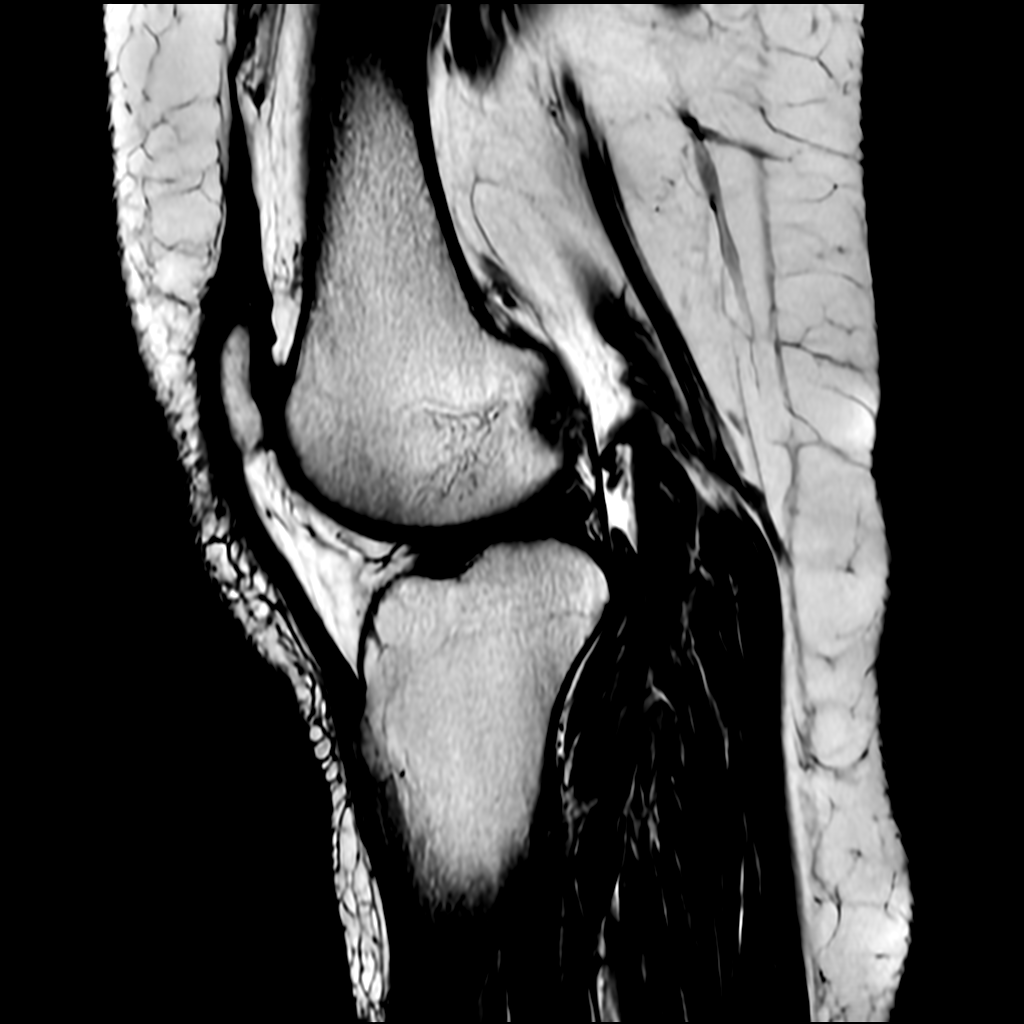
[im 28/28]
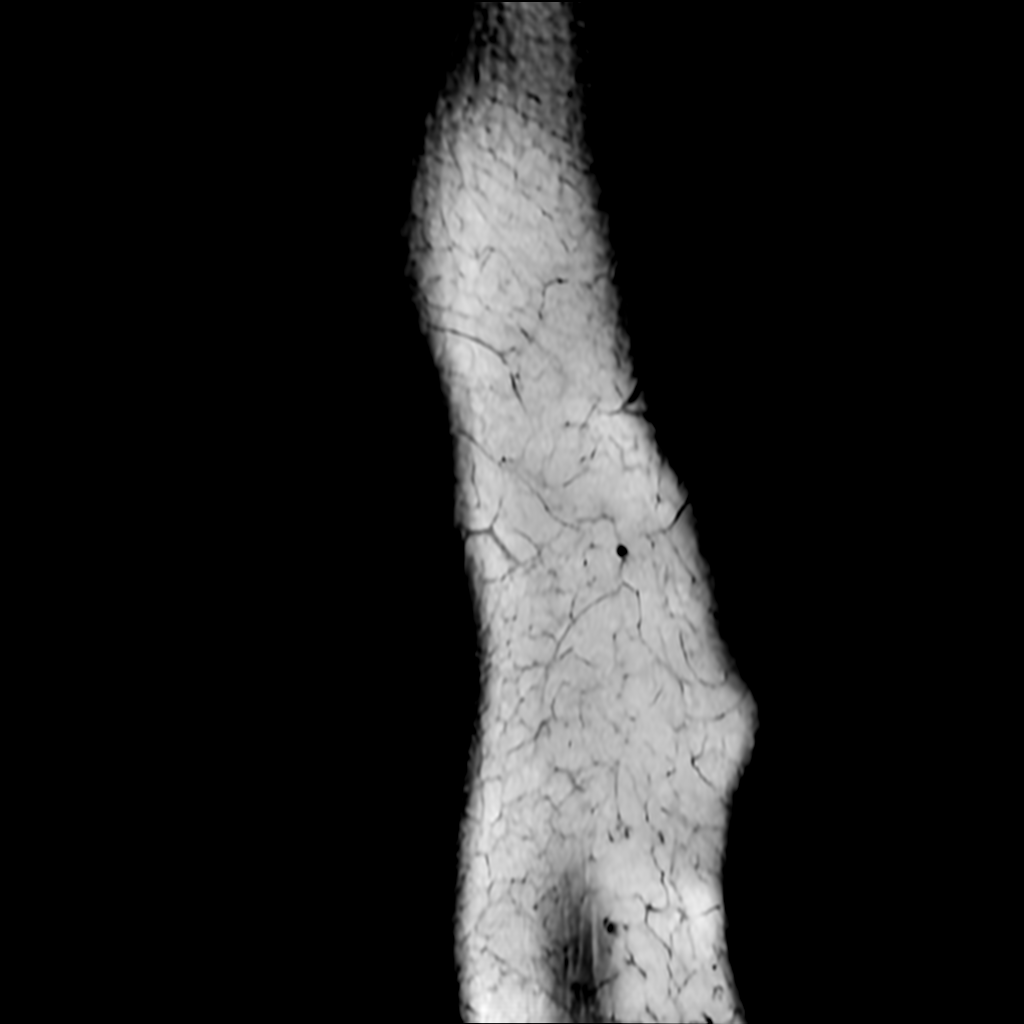

[Series 5: T2 fat-sat · coronal · 3.5mm · 0.33mm/px · 3 of 28 slices shown (1 of 2)]
[im 6/28]
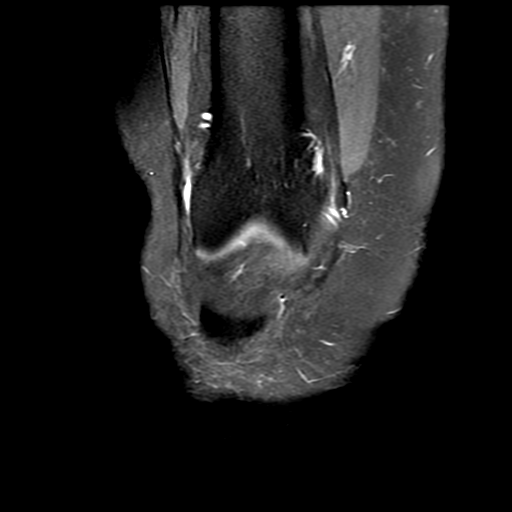
[im 17/28]
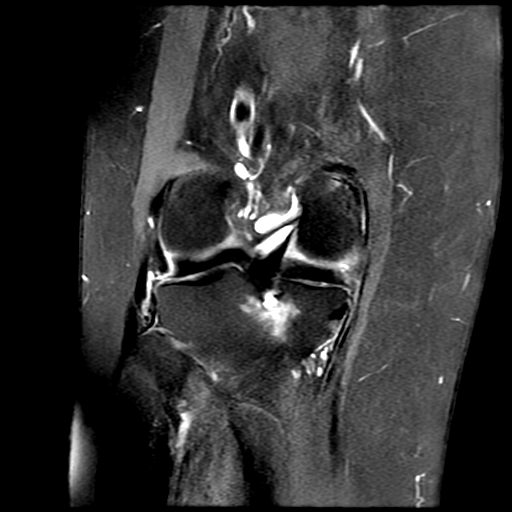
[im 28/28]
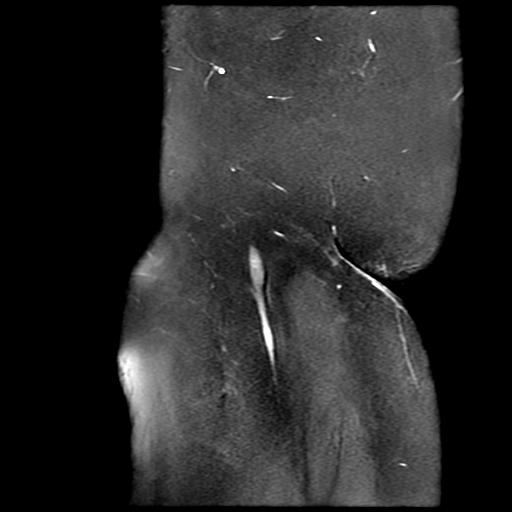

[Series 7: T2 fat-sat · axial · 4.0mm · 0.35mm/px · z∈[-25,+80]mm · 3 of 30 slices shown (2 of 2)]
[im 6/30]
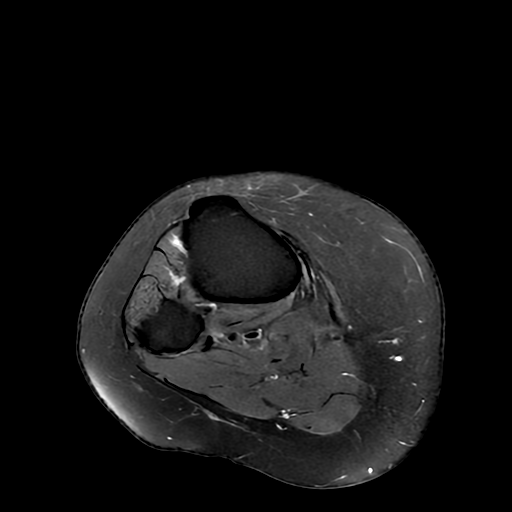
[im 18/30]
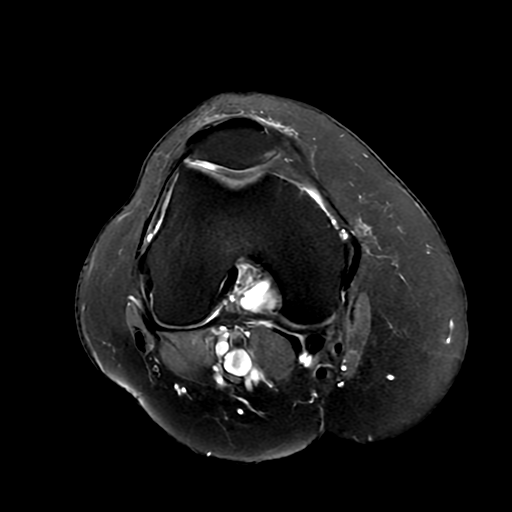
[im 30/30]
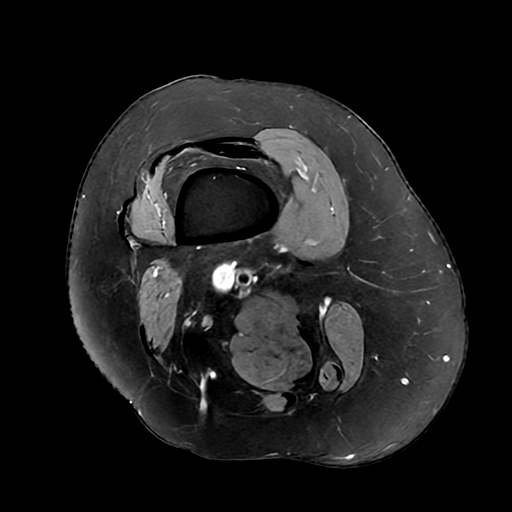

[9 of 40 positions shown; findings below may reference images not displayed]

Técnica:  Exame  obtido  com  sequências  Spin  Echo  ponderadas  em  T1  e  T2,  sem
injeção do contraste paramagnético.

Relatório:
RESSON[facility]NCIA MAGNÉTICA DO JOELHO DIREITO
Leve  subluxação  do  menisco  medial  com  sinais  de  amputação  da  margem  livre  no
corpo  e  no  corno  posterior,  além  de  alteração  de  sinal  intrameniscal,  sugerindo
alteração  pós-meniscectomia  /  degenerativa.  Nota-se  ainda  possível  deslocamento
meniscal inferior junto ao platô tibial medial ao nível do corpo.
Alteração degenerativa no menisco lateral, sem sinais de rupturas instáveis.
Ligamentos cruzados sem alterações.
Ligamentos colaterais e retináculos patelares sem alterações.
Cartilagem  patelo-femoral  com  leve  irregularidade  do  contorno  e  alteração  de  sinal
condral, observando  questionável  leve  edema  ósseo  subcondral  na  faceta  medial  da
patela, sugerindo condropatia degenerativa.
Edema  na  gordura  suprapatelar,  comumente  relacionado  a  disfunção  do  mecanismo
extensor.
Sinais  de  alteração  degenerativa nos  compartimentos  femorotibiais 
lateral  e
notadamente 
medial, 
caracterizado 
por osteófitos 
marginais
femorotibiais, irregularidade do contorno e alteração de sinal condral, notadamente em
compartimento medial, neste com redução do espaço articular com áreas de afilamento
da cartilagem com cistos e edema ósseo subcondral.
Alteração  fibrocicatricial  de  permeio  na  gordura  de  Hoffa,  notadamente  no  aspecto
medial, presumivelmente relacionado ao procedimento cirúrgico prévio / artroscopia.
Pequena efusão articular com sinais de leve sinovite.
Tendões quadríceps e patelar com espessura e sinal normais.
Sinais de leve tendinose na origem do gastrocnêmio medial.
Sinais de leve bursite / peritendinite de pata anserina.
Pequeno cisto de Baker com leve migração cranial . 

Impressão Diagnóstica:

Leve subluxação do menisco medial com alteração pós-meniscectomia / degenerativa
no  corpo  e  corno  posterior  e  possível  deslocamento  meniscal  inferior  junto  ao  platô
tibial medial ao nível do corpo, conforme descrito.
Alteração degenerativa no menisco lateral, sem rupturas instáveis.
Condropatia  degenerativa  patelo-femoral  com  questionável 
leve  edema  ósseo
subcondral na patela.
Edema  na  gordura  suprapatelar,  comumente  relacionado  a  disfunção  do  mecanismo
extensor.
Alteração  degenerativa nos  compartimentos  femorotibiais  lateral  e  notadamente
medial.
Alteração  fibrocicatricial  de  permeio  na  gordura  de  Hoffa,  notadamente  no  aspecto
medial, presumivelmente relacionado ao procedimento cirúrgico prévio / artroscopia.
Pequena efusão articular com leve sinovite.
Sinais de leve tendinose na origem do gastrocnêmio medial.
Sinais de leve bursite / peritendinite de pata anserina.
Pequeno cisto de Baker.

## 2024-05-16 IMAGING — MR RM JOELHO ESQUERDO
3 of 8 series · 9 of 40 positions shown · non-contrast
Comparison: none

[Series 4: T1 · sagittal · 3.5mm · 0.18mm/px · 3 of 28 slices shown]
[im 6/28]
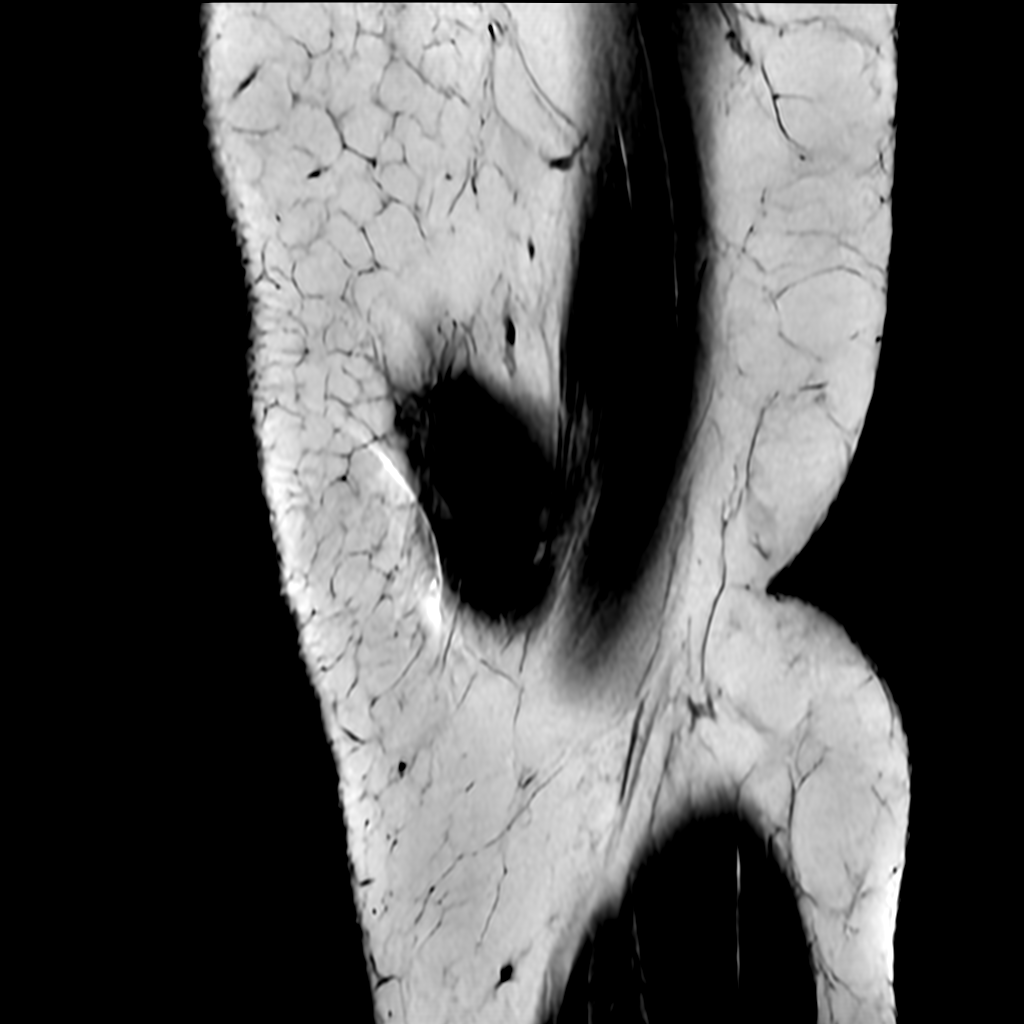
[im 17/28]
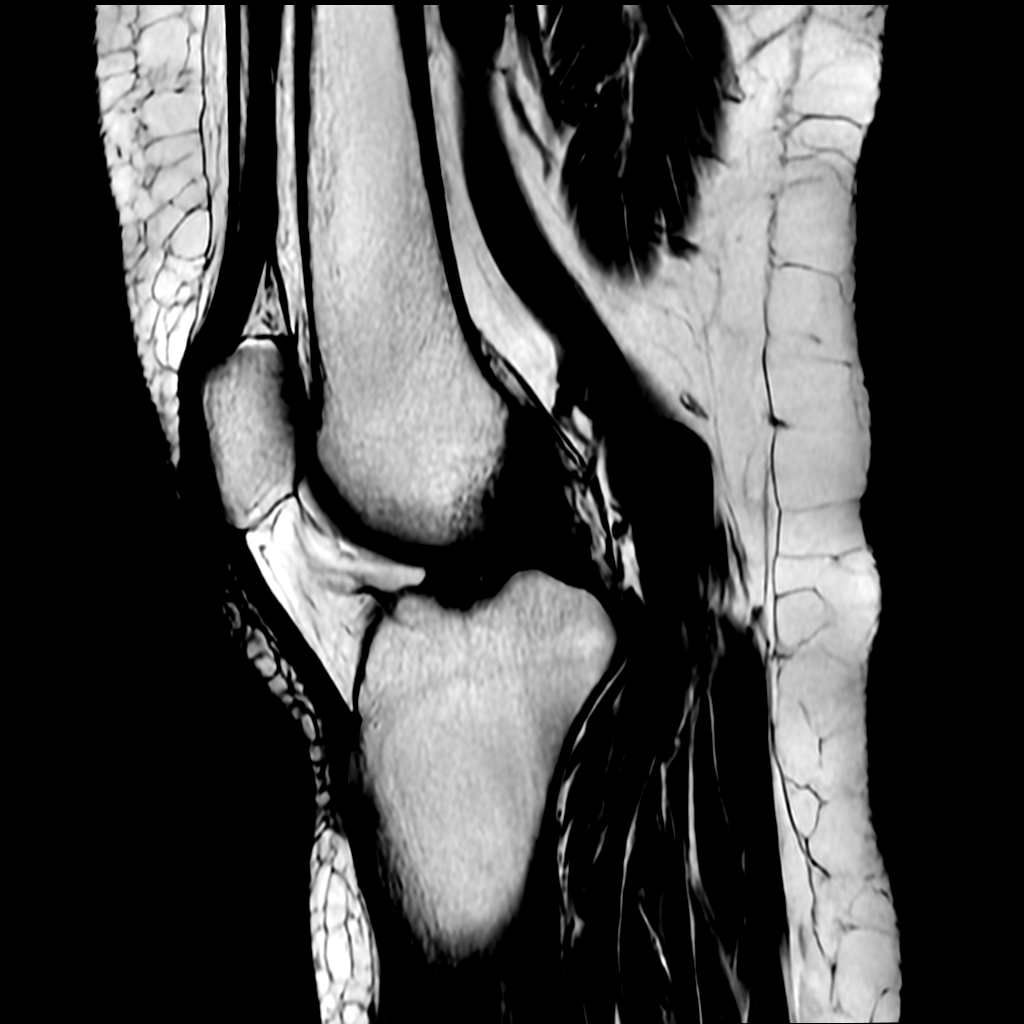
[im 28/28]
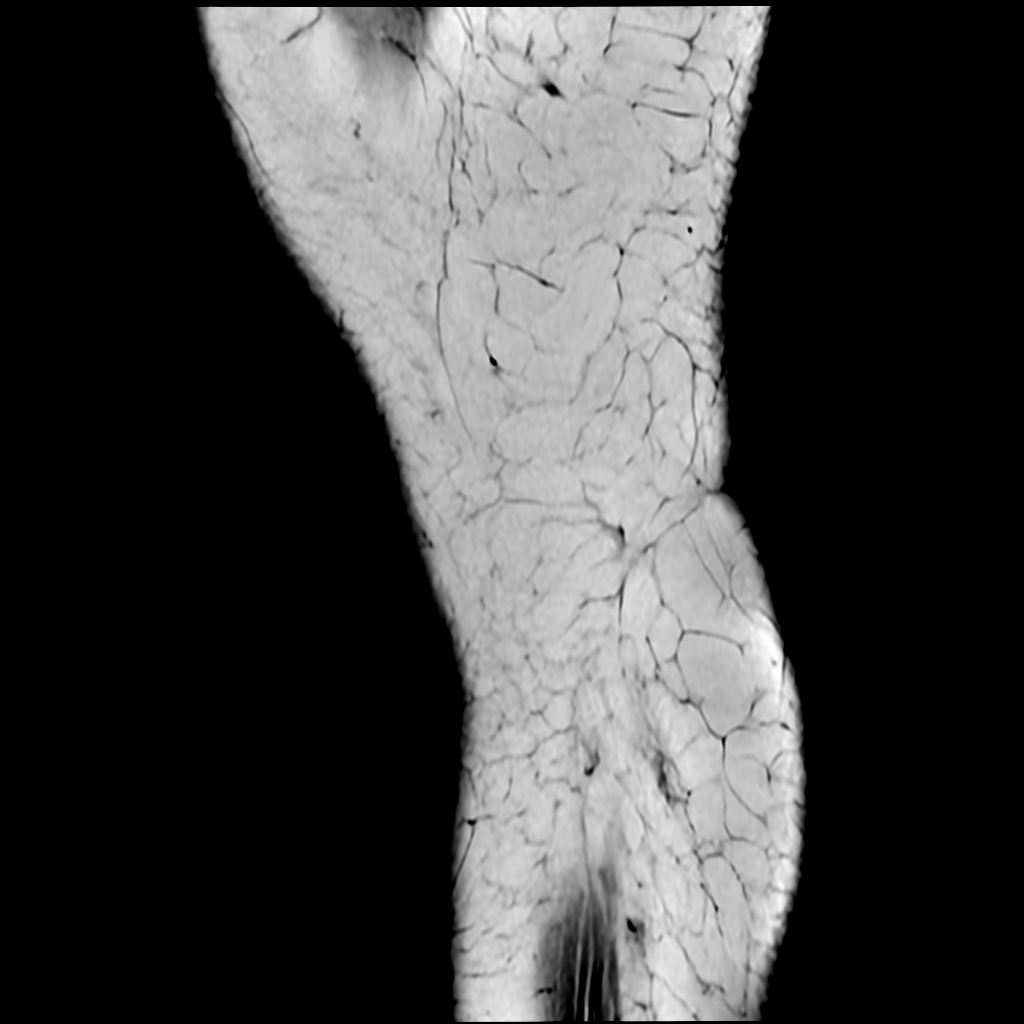

[Series 5: T2 fat-sat · coronal · 3.5mm · 0.33mm/px · 3 of 28 slices shown (1 of 2)]
[im 6/28]
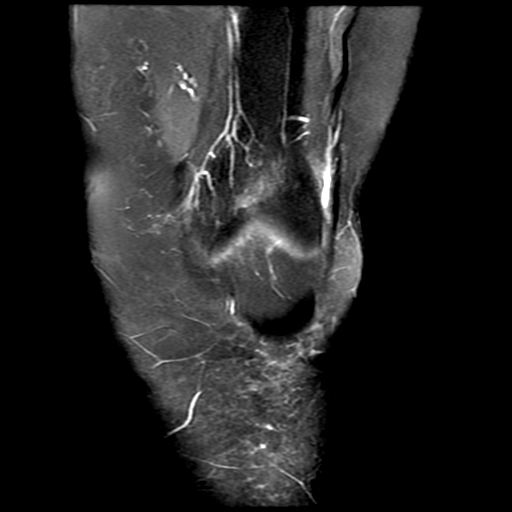
[im 17/28]
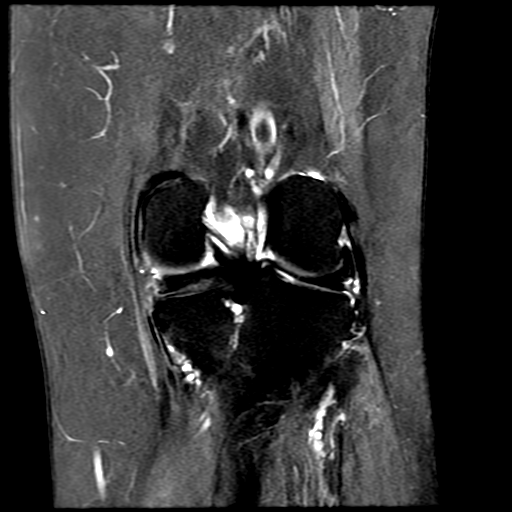
[im 28/28]
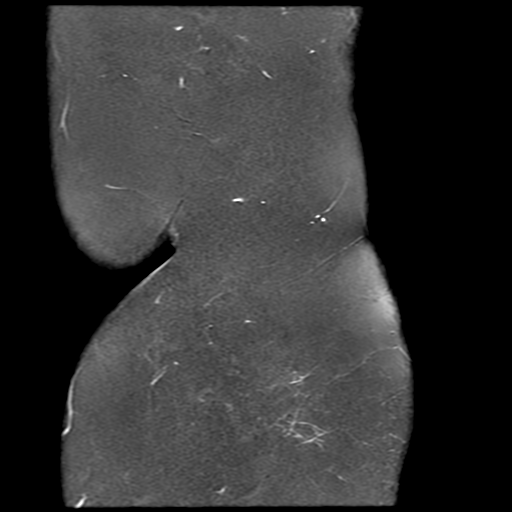

[Series 7: T2 fat-sat · axial · 4.0mm · 0.35mm/px · z∈[-64,+40]mm · 3 of 30 slices shown (2 of 2)]
[im 6/30]
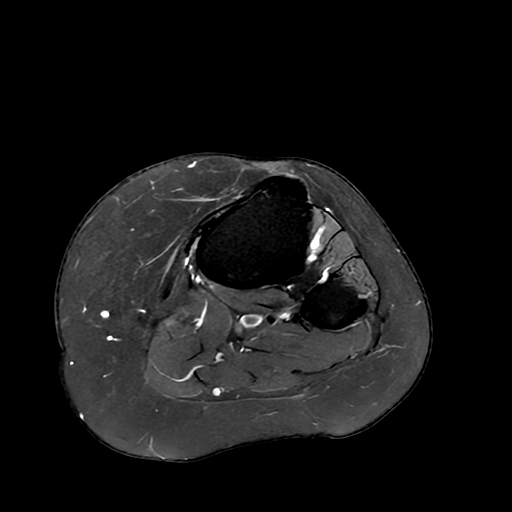
[im 18/30]
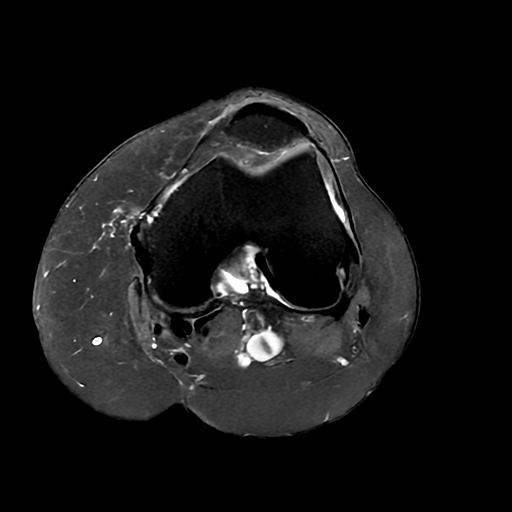
[im 30/30]
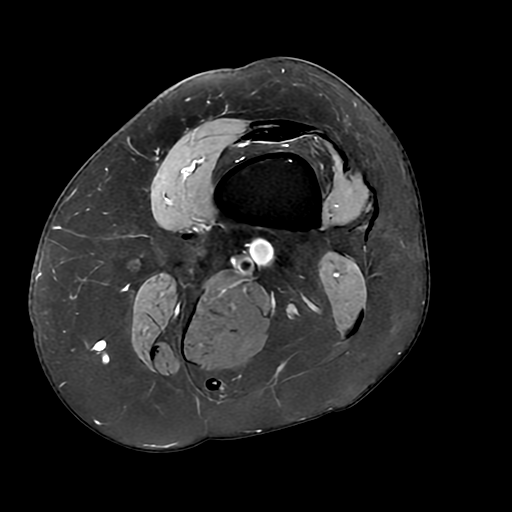

[9 of 40 positions shown; findings below may reference images not displayed]

Técnica:  Exame  obtido  com  sequências  Spin  Echo  ponderadas  em  T1  e  T2,  sem
injeção do contraste paramagnético.

RESSON[facility]NCIA MAGN[facility]TICA DO JOELHO ESQUERDO
Relatório:
Leve  subluxação  do  menisco  medial  com  alteração  de  sinal  no  corno  posterior,
atingindo  a  superfície  articular  inferior,  sugerindo  ruptura.  Nota-se  ainda  possível
deslocamento meniscal inferior junto ao platô tibial medial ao nível do corpo.
Alteração degenerativa no menisco lateral, sem sinais de rupturas instáveis.
Ligamentos cruzados sem alterações.
Ligamentos colaterais e retináculos patelares sem alterações.
Cartilagem  patelo-femoral  com  leve  irregularidade  do  contorno  e  alteração  de  sinal
condral, observando  questionável  tênue  edema  ósseo  subcondral  focal  na  crista
patelar, sugerindo condropatia degenerativa.
Edema  na  gordura  suprapatelar,  comumente  relacionado  a  disfunção  do  mecanismo
extensor.
Gordura de Hoffa sem alterações significativas.
Sinais  de  alteração  degenerativa nos  compartimentos  femorotibiais 
lateral  e
notadamente 
medial, 
caracterizado 
por osteófitos 
marginais
femorotibiais, irregularidade do contorno e alteração de sinal condral, notadamente em
compartimento medial, neste com redução do espaço articular com áreas de afilamento
da cartilagem.
Leve alteração fibrocística na tíbia proximal.
Pequena efusão articular com sinais de leve sinovite.
Tendões quadríceps e patelar com espessura e sinal normais.
Sinais de leve tendinose na origem do gastrocnêmio medial.
Sinais de leve bursite / peritendinite de pata anserina.
Fossa poplítea sem alterações significativas.

Impressão Diagnóstica:
Leve  subluxação  do  menisco  medial  com 
ruptura  no  corno  posterior  e

possível deslocamento meniscal inferior junto ao platô tibial medial ao nível do corpo.  
Alteração degenerativa no menisco lateral, sem rupturas instáveis.
Condropatia  degenerativa  patelo-femoral  com  questionável  tênue  edema  ósseo
subcondral focal na crista patelar
Edema  na  gordura  suprapatelar,  comumente  relacionado  a  disfunção  do  mecanismo
extensor.
Alteração  degenerativa nos  compartimentos  femorotibiais  lateral  e  notadamente
medial.
Leve alteração fibrocística na tíbia proximal.
Pequena efusão articular com leve sinovite.
Sinais de leve tendinose na origem do gastrocnêmio medial.
Sinais de leve bursite / peritendinite de pata anserina.

## 2024-06-06 ENCOUNTER — Inpatient Hospital Stay: Admit: 2024-06-06 | Discharge: 2024-06-06 | Payer: PRIVATE HEALTH INSURANCE | Primary: Internal Medicine

## 2024-07-12 ENCOUNTER — Inpatient Hospital Stay: Admit: 2024-07-12 | Discharge: 2024-07-12 | Payer: PRIVATE HEALTH INSURANCE | Primary: Internal Medicine

## 2024-08-30 ENCOUNTER — Inpatient Hospital Stay: Admit: 2024-08-30 | Discharge: 2024-08-31 | Payer: PRIVATE HEALTH INSURANCE | Primary: Internal Medicine

## 2024-11-22 ENCOUNTER — Ambulatory Visit: Admit: 2024-11-22 | Payer: PRIVATE HEALTH INSURANCE | Primary: Internal Medicine
